# Patient Record
Sex: Female | Born: 1985
Health system: Southern US, Community
[De-identification: ages and names within clinical notes are randomized; demographics above are authoritative.]

## PROBLEM LIST (undated history)

## (undated) ENCOUNTER — Inpatient Hospital Stay (HOSPITAL_COMMUNITY): Payer: Self-pay

## (undated) DIAGNOSIS — N289 Disorder of kidney and ureter, unspecified: Secondary | ICD-10-CM

## (undated) DIAGNOSIS — R87629 Unspecified abnormal cytological findings in specimens from vagina: Secondary | ICD-10-CM

## (undated) DIAGNOSIS — N200 Calculus of kidney: Secondary | ICD-10-CM

## (undated) DIAGNOSIS — K8020 Calculus of gallbladder without cholecystitis without obstruction: Secondary | ICD-10-CM

## (undated) DIAGNOSIS — J45909 Unspecified asthma, uncomplicated: Secondary | ICD-10-CM

## (undated) HISTORY — DX: Calculus of gallbladder without cholecystitis without obstruction: K80.20

---
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
## 2009-11-03 ENCOUNTER — Ambulatory Visit (HOSPITAL_COMMUNITY): Admission: RE | Admit: 2009-11-03 | Discharge: 2009-11-03 | Payer: Self-pay | Admitting: Obstetrics and Gynecology

## 2009-12-30 ENCOUNTER — Inpatient Hospital Stay (HOSPITAL_COMMUNITY): Admission: AD | Admit: 2009-12-30 | Discharge: 2009-12-31 | Payer: Self-pay | Admitting: Obstetrics and Gynecology

## 2009-12-30 ENCOUNTER — Ambulatory Visit: Payer: Self-pay | Admitting: Obstetrics & Gynecology

## 2010-01-15 ENCOUNTER — Emergency Department (HOSPITAL_COMMUNITY): Admission: EM | Admit: 2010-01-15 | Discharge: 2010-01-15 | Payer: Self-pay | Admitting: Emergency Medicine

## 2010-03-19 ENCOUNTER — Inpatient Hospital Stay (HOSPITAL_COMMUNITY): Admission: AD | Admit: 2010-03-19 | Discharge: 2010-03-20 | Payer: Self-pay | Admitting: Obstetrics and Gynecology

## 2010-03-25 ENCOUNTER — Inpatient Hospital Stay (HOSPITAL_COMMUNITY): Admission: AD | Admit: 2010-03-25 | Discharge: 2010-03-28 | Payer: Self-pay | Admitting: Obstetrics and Gynecology

## 2010-03-26 ENCOUNTER — Encounter (INDEPENDENT_AMBULATORY_CARE_PROVIDER_SITE_OTHER): Payer: Self-pay | Admitting: Obstetrics and Gynecology

## 2010-05-31 ENCOUNTER — Encounter: Payer: Self-pay | Admitting: Obstetrics and Gynecology

## 2010-07-21 LAB — CBC
HCT: 38.7 % (ref 36.0–46.0)
Hemoglobin: 10.7 g/dL — ABNORMAL LOW (ref 12.0–15.0)
Hemoglobin: 13.1 g/dL (ref 12.0–15.0)
MCHC: 34 g/dL (ref 30.0–36.0)
MCHC: 34.3 g/dL (ref 30.0–36.0)
MCV: 97.3 fL (ref 78.0–100.0)
Platelets: 270 10*3/uL (ref 150–400)
RBC: 3.18 MIL/uL — ABNORMAL LOW (ref 3.87–5.11)
RDW: 14.5 % (ref 11.5–15.5)
WBC: 13.4 10*3/uL — ABNORMAL HIGH (ref 4.0–10.5)

## 2010-07-23 LAB — COMPREHENSIVE METABOLIC PANEL
ALT: 19 U/L (ref 0–35)
AST: 23 U/L (ref 0–37)
CO2: 22 mEq/L (ref 19–32)
Calcium: 8.7 mg/dL (ref 8.4–10.5)
GFR calc Af Amer: >60 mL/min (ref >60)
GFR calc non Af Amer: >60 mL/min (ref >60)
Sodium: 134 mEq/L — ABNORMAL LOW (ref 135–145)

## 2010-07-23 LAB — GLUCOSE, CAPILLARY

## 2010-07-24 LAB — WET PREP, GENITAL: Trich, Wet Prep: NONE SEEN

## 2010-07-24 LAB — URINALYSIS, ROUTINE W REFLEX MICROSCOPIC
Bilirubin Urine: NEGATIVE
Nitrite: NEGATIVE
Specific Gravity, Urine: 1.01 (ref 1.005–1.030)
pH: 6.5 (ref 5.0–8.0)

## 2011-06-09 ENCOUNTER — Emergency Department (HOSPITAL_COMMUNITY)
Admission: EM | Admit: 2011-06-09 | Discharge: 2011-06-09 | Disposition: A | Discharge status: Home / Self Care | Payer: Medicaid Other | Attending: Emergency Medicine | Admitting: Emergency Medicine

## 2011-06-09 ENCOUNTER — Encounter (HOSPITAL_COMMUNITY): Payer: Self-pay | Admitting: *Deleted

## 2011-06-09 DIAGNOSIS — IMO0002 Reserved for concepts with insufficient information to code with codable children: Secondary | ICD-10-CM | POA: Insufficient documentation

## 2011-06-09 DIAGNOSIS — S0501XA Injury of conjunctiva and corneal abrasion without foreign body, right eye, initial encounter: Secondary | ICD-10-CM

## 2011-06-09 DIAGNOSIS — H5789 Other specified disorders of eye and adnexa: Secondary | ICD-10-CM | POA: Insufficient documentation

## 2011-06-09 DIAGNOSIS — H5710 Ocular pain, unspecified eye: Secondary | ICD-10-CM | POA: Insufficient documentation

## 2011-06-09 DIAGNOSIS — S058X9A Other injuries of unspecified eye and orbit, initial encounter: Secondary | ICD-10-CM | POA: Insufficient documentation

## 2011-06-09 DIAGNOSIS — S0510XA Contusion of eyeball and orbital tissues, unspecified eye, initial encounter: Secondary | ICD-10-CM | POA: Insufficient documentation

## 2011-06-09 HISTORY — DX: Disorder of kidney and ureter, unspecified: N28.9

## 2011-06-09 MED ORDER — IBUPROFEN 200 MG PO TABS
600.0000 mg | ORAL_TABLET | Freq: Once | ORAL | Status: AC
  Administered 2011-06-09: 600 mg via ORAL
  Filled 2011-06-09: qty 3

## 2011-06-09 MED ORDER — FLUORESCEIN SODIUM 1 MG OP STRP
ORAL_STRIP | OPHTHALMIC | Status: AC
  Filled 2011-06-09: qty 1

## 2011-06-09 MED ORDER — TETRACAINE HCL 0.5 % OP SOLN
OPHTHALMIC | Status: AC
  Filled 2011-06-09: qty 2

## 2011-06-09 NOTE — ED Notes (Signed)
Visual acuity resulted: LEFT- 20/30  RIGHT- 20/25  BOTH- 20/20

## 2011-06-09 NOTE — ED Provider Notes (Signed)
History     CSN: [id number]  Arrival date & time 06/09/11  1541   First MD Initiated Contact with Patient 06/09/11 1614      Chief Complaint  Patient presents with  . Eye Injury    (Consider location/radiation/quality/duration/timing/severity/associated sxs/prior treatment) HPI Comments: 26yo AAF with PMH significant for kidney stones who presents to the ED due to eye pain. She was playing with her toddler son when he accidentally put his finger into her right eye.   Patient is a 26 y.o. female presenting with eye injury. The history is provided by the patient.  Eye Injury This is a new problem. Episode onset: about 4.5h prior to arrival. Episode frequency: once. The problem has been unchanged. Pertinent negatives include no abdominal pain, chest pain, congestion, fever, headaches, nausea, neck pain, numbness, sore throat, visual change or vomiting. The symptoms are aggravated by nothing. She has tried acetaminophen for the symptoms. The treatment provided mild relief.    Past Medical History  Diagnosis Date  . Renal disorder     History reviewed. No pertinent past surgical history.  History reviewed. No pertinent family history.  History  Substance Use Topics  . Smoking status: Not on file  . Smokeless tobacco: Not on file  . Alcohol Use: No    OB History    Grav Para Term Preterm Abortions TAB SAB Ect Mult Living                  Review of Systems  Constitutional: Negative for fever.  HENT: Negative for congestion, sore throat, rhinorrhea and neck pain.   Eyes: Positive for pain and redness. Negative for photophobia and visual disturbance.  Respiratory: Negative for shortness of breath.   Cardiovascular: Negative for chest pain.  Gastrointestinal: Negative for nausea, vomiting and abdominal pain.  Skin: Negative for wound.  Neurological: Negative for numbness and headaches.  All other systems reviewed and are negative.    Allergies  Latex  Home  Medications   Current Outpatient Rx  Name Route Sig Dispense Refill  . ACETAMINOPHEN 500 MG PO TABS Oral Take 500 mg by mouth every 6 (six) hours as needed. For pain      BP 128/74  Pulse 80  Temp(Src) 98.3 F (36.8 C) (Oral)  Resp 18  Ht 5\' 5"  (1.651 m)  Wt 161 lb (73.029 kg)  BMI 26.79 kg/m2  SpO2 97%  LMP 05/26/2011  Physical Exam  Nursing note and vitals reviewed. Constitutional: She is oriented to person, place, and time. She appears well-developed and well-nourished. No distress.  HENT:  Mouth/Throat: Oropharynx is clear and moist.  Eyes: Conjunctivae and EOM are normal. Pupils are equal, round, and reactive to light. No foreign bodies found. Right eye exhibits discharge (clear). Left eye exhibits no discharge. Right conjunctiva has no hemorrhage. Left conjunctiva has no hemorrhage. No scleral icterus.  Fundoscopic exam:      The right eye shows no hemorrhage and no papilledema.  Slit lamp exam:      The right eye shows corneal abrasion. The right eye shows no corneal flare, no corneal ulcer, no foreign body, no hyphema and no hypopyon.       4mm ovoid shaped corneal abrasion to right eye at 7 o'clock position. No fluid coming from abrasion. Exam with slit lamp and fluorescein stain.   Neck: Normal range of motion. Neck supple.  Cardiovascular: Normal rate, regular rhythm, normal heart sounds and intact distal pulses.   Pulmonary/Chest: Effort normal and breath  sounds normal. No respiratory distress. She has no wheezes. She has no rales.  Abdominal: Soft. She exhibits no distension. There is no tenderness.  Musculoskeletal: Normal range of motion.  Neurological: She is alert and oriented to person, place, and time. She has normal strength. No cranial nerve deficit or sensory deficit. GCS eye subscore is 4. GCS verbal subscore is 5. GCS motor subscore is 6.    ED Course  Procedures (including critical care time)  Labs Reviewed - No data to display No results  found.   1. Corneal abrasion, right       MDM  26yo AAF with PMH significant for kidney stones who presents to the ED due to eye pain. She was playing with her toddler son when he accidentally put his finger into her right eye. No vision changes. No headache. No bleeding or discharge.   Tetracaine gtts applied.   Exam with slit lamp and fluoroscein stain. Exam c/w corneal abrasion. Giving motrin. Recommend f/u with optho and pt agrees.         Verne Carrow, MD 06/09/11 [phone]

## 2011-06-09 NOTE — ED Provider Notes (Addendum)
I saw and evaluated the patient, reviewed the resident's note and I agree with the findings and plan. Pt c/o accidental injru to right eye after son poked her in eye with finger.   + corneal abrasion.  Will tx with abxs and analgesics.  Nicholes Stairs, MD 06/09/11 1819  Nicholes Stairs, MD 06/10/11 1512  Nicholes Stairs, MD 06/10/11 [phone]

## 2011-06-09 NOTE — ED Notes (Signed)
Reports being poked in right eye, having severe pain, burning and drainage, no change in vision.

## 2011-06-10 NOTE — ED Provider Notes (Signed)
I saw and evaluated the patient, reviewed the resident's note and I agree with the findings and plan.  Nicholes Stairs, MD 06/10/11 [phone]

## 2011-12-26 ENCOUNTER — Emergency Department (HOSPITAL_COMMUNITY)
Admission: EM | Admit: 2011-12-26 | Discharge: 2011-12-26 | Disposition: A | Discharge status: Home / Self Care | Payer: 59 | Attending: Emergency Medicine | Admitting: Emergency Medicine

## 2011-12-26 ENCOUNTER — Encounter (HOSPITAL_COMMUNITY): Payer: Self-pay | Admitting: Emergency Medicine

## 2011-12-26 DIAGNOSIS — X58XXXA Exposure to other specified factors, initial encounter: Secondary | ICD-10-CM | POA: Insufficient documentation

## 2011-12-26 DIAGNOSIS — Z91040 Latex allergy status: Secondary | ICD-10-CM | POA: Insufficient documentation

## 2011-12-26 DIAGNOSIS — S0501XA Injury of conjunctiva and corneal abrasion without foreign body, right eye, initial encounter: Secondary | ICD-10-CM

## 2011-12-26 DIAGNOSIS — Z87442 Personal history of urinary calculi: Secondary | ICD-10-CM | POA: Insufficient documentation

## 2011-12-26 DIAGNOSIS — N289 Disorder of kidney and ureter, unspecified: Secondary | ICD-10-CM | POA: Insufficient documentation

## 2011-12-26 DIAGNOSIS — S058X9A Other injuries of unspecified eye and orbit, initial encounter: Secondary | ICD-10-CM | POA: Insufficient documentation

## 2011-12-26 HISTORY — DX: Calculus of kidney: N20.0

## 2011-12-26 MED ORDER — HYDROCODONE-ACETAMINOPHEN 5-325 MG PO TABS
2.0000 | ORAL_TABLET | ORAL | Status: AC | PRN
Start: 2011-12-26 — End: 2012-01-05

## 2011-12-26 MED ORDER — ERYTHROMYCIN 5 MG/GM OP OINT: TOPICAL_OINTMENT | OPHTHALMIC | Status: AC

## 2011-12-26 MED ORDER — HOMATROPINE HBR 2 % OP SOLN
2.0000 [drp] | Freq: Once | OPHTHALMIC | Status: DC
  Filled 2011-12-26: qty 5

## 2011-12-26 MED ORDER — TETRACAINE HCL 0.5 % OP SOLN
OPHTHALMIC | Status: AC
  Filled 2011-12-26: qty 2

## 2011-12-26 MED ORDER — HOMATROPINE HBR 5 % OP SOLN: 2.0000 [drp] | Freq: Once | OPHTHALMIC | Status: DC

## 2011-12-26 MED ORDER — HOMATROPINE HBR 5 % OP SOLN: 1.0000 [drp] | Freq: Once | OPHTHALMIC | Status: AC

## 2011-12-26 NOTE — ED Provider Notes (Signed)
I saw and evaluated the patient, reviewed the resident's note and I agree with the findings and plan.   .Face to face Exam:  General:  Awake HEENT:  Atraumatic Resp:  Normal effort Abd:  Nondistended Neuro:No focal weakness Lymph: No adenopathy   Nelia Shi, MD 12/26/11 1750

## 2011-12-26 NOTE — ED Notes (Signed)
Right eye red, swollen, draining onset yesterday.

## 2011-12-26 NOTE — ED Notes (Signed)
Pt out of town and developed eye discomfort yesterday. Today eye red, draining, painful, feels like "something" is in my eye.

## 2011-12-26 NOTE — ED Provider Notes (Signed)
History     CSN: [id number]  Arrival date & time 12/26/11  1449   First MD Initiated Contact with Patient 12/26/11 1551      Chief Complaint  Patient presents with  . Eye Pain    (Consider location/radiation/quality/duration/timing/severity/associated sxs/prior treatment) HPI Comments: Patient presents with a 2 day history of right eye pain. She reports waking up with eye pain, as if there was something in her eye. She reports her eye being red and watering, painful, and swollen. She reports blurry vision in the right eye. She denies trauma to that eye. She reports mild relief of the pain with ibuprofen. She denies itching, sores, or recent illness.   Patient is a 26 y.o. female presenting with eye pain.  Eye Pain Pertinent negatives include no abdominal pain, chills, congestion, coughing, diaphoresis, fatigue, headaches, nausea, rash or vomiting.    Past Medical History  Diagnosis Date  . Renal disorder   . Kidney calculus     History reviewed. No pertinent past surgical history.  No family history on file.  History  Substance Use Topics  . Smoking status: Never Smoker   . Smokeless tobacco: Never Used  . Alcohol Use: Yes     wine rarely    OB History    Grav Para Term Preterm Abortions TAB SAB Ect Mult Living                  Review of Systems  Constitutional: Negative for chills, diaphoresis and fatigue.  HENT: Negative for congestion.   Eyes: Positive for photophobia, pain, redness and visual disturbance. Negative for discharge and itching.  Respiratory: Negative for cough and shortness of breath.   Gastrointestinal: Negative for nausea, vomiting, abdominal pain and diarrhea.  Skin: Negative for rash and wound.  Neurological: Negative for dizziness, light-headedness and headaches.    Allergies  Latex  Home Medications   Current Outpatient Rx  Name Route Sig Dispense Refill  . ACETAMINOPHEN 500 MG PO TABS Oral Take 500 mg by mouth every 6 (six) hours  as needed. For pain    . NORETHINDRONE 0.35 MG PO TABS Oral Take 1 tablet by mouth daily.    . ERYTHROMYCIN 5 MG/GM OP OINT  Place a 1/2 inch ribbon of ointment into the lower eyelid. 1 g 0  . HOMATROPINE HBR 5 % OP SOLN Right Eye Place 1 drop into the right eye once. 15 mL 0  . HYDROCODONE-ACETAMINOPHEN 5-325 MG PO TABS Oral Take 2 tablets by mouth every 4 (four) hours as needed for pain. 6 tablet 0    BP 145/80  Pulse 74  Temp 98.6 F (37 C) (Oral)  Resp 16  SpO2 100%  LMP 11/27/2011  Physical Exam  Nursing note and vitals reviewed. Constitutional: She is oriented to person, place, and time. She appears well-developed and well-nourished. No distress.  HENT:  Head: Normocephalic and atraumatic.  Mouth/Throat: Oropharynx is clear and moist. No oropharyngeal exudate.  Eyes:       R eye shows scleral injection and profuse watering. L eye shows clear sclera and no swelling.   Neck: Normal range of motion. Neck supple.  Cardiovascular: Normal rate and regular rhythm.  Exam reveals no gallop and no friction rub.   No murmur heard. Pulmonary/Chest: Effort normal. She has no wheezes. She has no rales. She exhibits no tenderness.  Musculoskeletal: Normal range of motion.  Neurological: She is alert and oriented to person, place, and time.  Skin: Skin is warm and  dry. She is not diaphoretic.  Psychiatric: She has a normal mood and affect. Her behavior is normal.    ED Course  Procedures (including critical care time)  Labs Reviewed - No data to display No results found.   1. Corneal abrasion, right       MDM  4:56 PM Patient evaluated using fluorescin staining. During which, a corneal abrasion was identified. I will discharge her with ophthalmic eyrthromycin, homatropine drops, and pain medication for comfort. I have recommended her to follow up with Dr. Luciana Axe, Ophthalmology, in the morning. I will not apply a pressure patch due to [employer]'s current recommendation of  nonintervention regarding eye patching with corneal abrasions. She should return to the ED with any worsening symptoms. Plan discussed with Dr. Radford Pax who is agreeable.         Emilia Beck, PA-C 12/26/11 1736

## 2011-12-26 NOTE — ED Notes (Signed)
Discharge instructions reviewed w/ pt., verbalizes understanding. Three prescriptions provided at discharge. 

## 2014-06-26 LAB — OB RESULTS CONSOLE GC/CHLAMYDIA
Chlamydia: NEGATIVE
Gonorrhea: NEGATIVE

## 2014-06-26 LAB — OB RESULTS CONSOLE ABO/RH: RH TYPE: POSITIVE

## 2014-06-26 LAB — OB RESULTS CONSOLE RPR: RPR: NONREACTIVE

## 2014-06-26 LAB — OB RESULTS CONSOLE RUBELLA ANTIBODY, IGM: RUBELLA: IMMUNE

## 2014-06-26 LAB — OB RESULTS CONSOLE HEPATITIS B SURFACE ANTIGEN: HEP B S AG: NEGATIVE

## 2014-06-26 LAB — OB RESULTS CONSOLE HIV ANTIBODY (ROUTINE TESTING): HIV: NONREACTIVE

## 2014-06-26 LAB — OB RESULTS CONSOLE ANTIBODY SCREEN: Antibody Screen: NEGATIVE

## 2014-10-28 ENCOUNTER — Encounter (HOSPITAL_COMMUNITY): Payer: Self-pay | Admitting: *Deleted

## 2014-10-28 ENCOUNTER — Inpatient Hospital Stay (HOSPITAL_COMMUNITY): Payer: BLUE CROSS/BLUE SHIELD

## 2014-10-28 ENCOUNTER — Observation Stay (HOSPITAL_COMMUNITY)
Admission: AD | Admit: 2014-10-28 | Discharge: 2014-10-30 | Disposition: A | Discharge status: Home / Self Care | Payer: BLUE CROSS/BLUE SHIELD | Source: Ambulatory Visit | Attending: Obstetrics and Gynecology | Admitting: Obstetrics and Gynecology

## 2014-10-28 DIAGNOSIS — Z3A33 33 weeks gestation of pregnancy: Secondary | ICD-10-CM | POA: Insufficient documentation

## 2014-10-28 DIAGNOSIS — N200 Calculus of kidney: Secondary | ICD-10-CM | POA: Diagnosis not present

## 2014-10-28 DIAGNOSIS — Z91040 Latex allergy status: Secondary | ICD-10-CM | POA: Insufficient documentation

## 2014-10-28 DIAGNOSIS — O26833 Pregnancy related renal disease, third trimester: Principal | ICD-10-CM | POA: Insufficient documentation

## 2014-10-28 DIAGNOSIS — O26839 Pregnancy related renal disease, unspecified trimester: Secondary | ICD-10-CM

## 2014-10-28 DIAGNOSIS — O99891 Other specified diseases and conditions complicating pregnancy: Secondary | ICD-10-CM

## 2014-10-28 DIAGNOSIS — R109 Unspecified abdominal pain: Secondary | ICD-10-CM

## 2014-10-28 HISTORY — DX: Unspecified abnormal cytological findings in specimens from vagina: R87.629

## 2014-10-28 LAB — COMPREHENSIVE METABOLIC PANEL
ALK PHOS: 94 U/L (ref 38–126)
ALT: 13 U/L — ABNORMAL LOW (ref 14–54)
ANION GAP: 6 (ref 5–15)
AST: 28 U/L (ref 15–41)
Albumin: 3.4 g/dL — ABNORMAL LOW (ref 3.5–5.0)
BILIRUBIN TOTAL: 0.9 mg/dL (ref 0.3–1.2)
BUN: 6 mg/dL (ref 6–20)
CO2: 23 mmol/L (ref 22–32)
Calcium: 9 mg/dL (ref 8.9–10.3)
Chloride: 107 mmol/L (ref 101–111)
Creatinine, Ser: 0.65 mg/dL (ref 0.44–1.00)
GFR calc non Af Amer: >60 mL/min (ref >60)
GLUCOSE: 96 mg/dL (ref 65–99)
Potassium: 4.9 mmol/L (ref 3.5–5.1)
SODIUM: 136 mmol/L (ref 135–145)
Total Protein: 6.7 g/dL (ref 6.5–8.1)

## 2014-10-28 LAB — CBC WITH DIFFERENTIAL/PLATELET
BASOS ABS: 0 10*3/uL (ref 0.0–0.1)
BASOS PCT: 0 % (ref 0–1)
EOS ABS: 0.1 10*3/uL (ref 0.0–0.7)
EOS PCT: 1 % (ref 0–5)
HCT: 39.8 % (ref 36.0–46.0)
Hemoglobin: 13.3 g/dL (ref 12.0–15.0)
Lymphocytes Relative: 24 % (ref 12–46)
Lymphs Abs: 2.5 10*3/uL (ref 0.7–4.0)
MCH: 31.5 pg (ref 26.0–34.0)
MCHC: 33.4 g/dL (ref 30.0–36.0)
MCV: 94.3 fL (ref 78.0–100.0)
MONO ABS: 1 10*3/uL (ref 0.1–1.0)
Monocytes Relative: 10 % (ref 3–12)
NEUTROS PCT: 65 % (ref 43–77)
Neutro Abs: 6.6 10*3/uL (ref 1.7–7.7)
PLATELETS: 252 10*3/uL (ref 150–400)
RBC: 4.22 MIL/uL (ref 3.87–5.11)
RDW: 14.5 % (ref 11.5–15.5)
WBC: 10.2 10*3/uL (ref 4.0–10.5)

## 2014-10-28 LAB — URINALYSIS, DIPSTICK ONLY
Bilirubin Urine: NEGATIVE
Glucose, UA: NEGATIVE mg/dL
Ketones, ur: NEGATIVE mg/dL
NITRITE: NEGATIVE
PH: 6 (ref 5.0–8.0)
Protein, ur: NEGATIVE mg/dL
Urobilinogen, UA: 0.2 mg/dL (ref 0.0–1.0)

## 2014-10-28 MED ORDER — HYDROMORPHONE HCL 1 MG/ML IJ SOLN: 1.0000 mg | Freq: Once | INTRAMUSCULAR | Status: DC

## 2014-10-28 MED ORDER — LACTATED RINGERS IV BOLUS (SEPSIS)
1000.0000 mL | Freq: Once | INTRAVENOUS | Status: AC
  Administered 2014-10-28: 1000 mL via INTRAVENOUS

## 2014-10-28 MED ORDER — FENTANYL CITRATE (PF) 100 MCG/2ML IJ SOLN
50.0000 ug | Freq: Once | INTRAMUSCULAR | Status: AC
  Administered 2014-10-29: 50 ug via INTRAVENOUS
  Filled 2014-10-28: qty 2

## 2014-10-28 MED ORDER — ONDANSETRON HCL 4 MG/2ML IJ SOLN
4.0000 mg | Freq: Once | INTRAMUSCULAR | Status: AC
  Administered 2014-10-28: 4 mg via INTRAVENOUS
  Filled 2014-10-28: qty 2

## 2014-10-28 MED ORDER — HYDROMORPHONE HCL 1 MG/ML IJ SOLN
1.0000 mg | Freq: Once | INTRAMUSCULAR | Status: AC
  Administered 2014-10-28: 1 mg via INTRAVENOUS
  Filled 2014-10-28: qty 1

## 2014-10-28 NOTE — MAU Note (Signed)
Pt started with left flank pain today while at work.  Progressively became worse along with nausea and vomiting with worsening pain.  Denies pain or blood upon urination.  Reports good fetalmovement.

## 2014-10-28 NOTE — MAU Provider Note (Signed)
History     CSN: [id number]  Arrival date and time: 10/28/14 2148   First Provider Initiated Contact with Patient 10/28/14 2221      No chief complaint on file.  HPI  Tina Anderson is a 29 y.o. G2P1001 at [redacted]w[redacted]d who presents to MAU today with complaint of left flank pain since this afternoon that has progressively worsened. The patient states pain is severe. She took Tylenol without relief. She denies hematuria, vaginal bleeding or contractions upon arrival in MAU. She reports +N/V with pain. She reports good fetal movement. She states a history of a previous kidney stone.   OB History    Gravida Para Term Preterm AB TAB SAB Ectopic Multiple Living   Past Medical History  Diagnosis Date  . Renal disorder   . Kidney calculus   . Vaginal Pap smear, abnormal     History reviewed. No pertinent past surgical history.  History reviewed. No pertinent family history.  History  Substance Use Topics  . Smoking status: Never Smoker   . Smokeless tobacco: Never Used  . Alcohol Use: Yes     Comment: wine rarely, not while preg    Allergies:  Allergies  Allergen Reactions  . Latex Hives    Prescriptions prior to admission  Medication Sig Dispense Refill Last Dose  . acetaminophen (TYLENOL) 325 MG tablet Take 325 mg by mouth every 6 (six) hours as needed for moderate pain.   10/28/2014 at Unknown time  . Prenatal Vit-Fe Fumarate-FA (PRENATAL MULTIVITAMIN) TABS tablet Take 1 tablet by mouth daily at 12 noon.   10/27/2014 at Unknown time  . ranitidine (ZANTAC) 150 MG tablet Take 150 mg by mouth 2 (two) times daily.   10/28/2014 at Unknown time  . acetaminophen (TYLENOL) 500 MG tablet Take 500 mg by mouth every 6 (six) hours as needed. For pain   Not Taking    Review of Systems  Constitutional: Negative for fever and malaise/fatigue.  Gastrointestinal: Positive for nausea and vomiting. Negative for abdominal pain, diarrhea and constipation.  Genitourinary:  Positive for flank pain. Negative for dysuria, urgency, frequency and hematuria.       Neg - vaginal bleeding, discharge   Physical Exam   Blood pressure 131/70, pulse 73, temperature 98 F (36.7 C), temperature source Oral, resp. rate 20.  Physical Exam  Nursing note and vitals reviewed. Constitutional: She is oriented to person, place, and time. She appears well-developed and well-nourished. No distress.  Appears uncomfortable  HENT:  Head: Normocephalic and atraumatic.  Cardiovascular: Normal rate.   Respiratory: Effort normal.  GI: Soft. She exhibits no distension and no mass. There is no tenderness. There is CVA tenderness (left). There is no rebound and no guarding.  Neurological: She is alert and oriented to person, place, and time.  Skin: Skin is warm and dry. No erythema.  Psychiatric: She has a normal mood and affect.   Results for orders placed or performed during the hospital encounter of 10/28/14 (from the past 24 hour(s))  Urinalysis, dipstick only     Status: Abnormal   Collection Time: 10/28/14 10:05 PM  Result Value Ref Range   Specific Gravity, Urine >1.030 (H) 1.005 - 1.030   pH 6.0 5.0 - 8.0   Glucose, UA NEGATIVE NEGATIVE mg/dL   Hgb urine dipstick TRACE (A) NEGATIVE   Bilirubin Urine NEGATIVE NEGATIVE   Ketones, ur NEGATIVE NEGATIVE mg/dL  Protein, ur NEGATIVE NEGATIVE mg/dL   Urobilinogen, UA 0.2 0.0 - 1.0 mg/dL   Nitrite NEGATIVE NEGATIVE   Leukocytes, UA TRACE (A) NEGATIVE  CBC with Differential/Platelet     Status: None   Collection Time: 10/28/14 10:33 PM  Result Value Ref Range   WBC 10.2 4.0 - 10.5 K/uL   RBC 4.22 3.87 - 5.11 MIL/uL   Hemoglobin 13.3 12.0 - 15.0 g/dL   HCT 04.5 40.9 - 81.1 %   MCV 94.3 78.0 - 100.0 fL   MCH 31.5 26.0 - 34.0 pg   MCHC 33.4 30.0 - 36.0 g/dL   RDW 91.4 78.2 - 95.6 %   Platelets 252 150 - 400 K/uL   Neutrophils Relative % 65 43 - 77 %   Neutro Abs 6.6 1.7 - 7.7 K/uL   Lymphocytes Relative 24 12 - 46 %    Lymphs Abs 2.5 0.7 - 4.0 K/uL   Monocytes Relative 10 3 - 12 %   Monocytes Absolute 1.0 0.1 - 1.0 K/uL   Eosinophils Relative 1 0 - 5 %   Eosinophils Absolute 0.1 0.0 - 0.7 K/uL   Basophils Relative 0 0 - 1 %   Basophils Absolute 0.0 0.0 - 0.1 K/uL  Comprehensive metabolic panel     Status: Abnormal   Collection Time: 10/28/14 10:33 PM  Result Value Ref Range   Sodium 136 135 - 145 mmol/L   Potassium 4.9 3.5 - 5.1 mmol/L   Chloride 107 101 - 111 mmol/L   CO2 23 22 - 32 mmol/L   Glucose, Bld 96 65 - 99 mg/dL   BUN 6 6 - 20 mg/dL   Creatinine, Ser 2.13 0.44 - 1.00 mg/dL   Calcium 9.0 8.9 - 08.6 mg/dL   Total Protein 6.7 6.5 - 8.1 g/dL   Albumin 3.4 (L) 3.5 - 5.0 g/dL   AST 28 15 - 41 U/L   ALT 13 (L) 14 - 54 U/L   Alkaline Phosphatase 94 38 - 126 U/L   Total Bilirubin 0.9 0.3 - 1.2 mg/dL   GFR calc non Af Amer >60 >60 mL/min   GFR calc Af Amer >60 >60 mL/min   Anion gap 6 5 - 15    US Renal  10/29/2014   CLINICAL DATA:  Patient with left flank pain.  Nausea and vomiting.  EXAM: RENAL / URINARY TRACT ULTRASOUND COMPLETE  COMPARISON:  None.  FINDINGS: Right Kidney:  Length: 10.9 cm. Possible 4 mm nonobstructing stone superior pole right kidney. No hydronephrosis.  Left Kidney:  Length: 12.7 cm.  No hydronephrosis.  Bladder:  Appears normal for degree of bladder distention.  IMPRESSION: No hydronephrosis.  Possible nonobstructing stone superior pole right kidney.   Electronically Signed   By: Annia Belt M.D.   On: 10/29/2014 00:21    Fetal Monitoring: Baseline: 130 bpm, moderate variability, + accelerations, no decelerations Contractions: initially UI noted, after patient returned from [place] contractions q 4-5 minutes, mild  MAU Course  Procedures None  MDM Dr. Senaida Ores called ahead to MAU regarding this patient. Recommends labs, pain medication, fetal monitoring and possible renal US if exam warrants CBC, CMP, UA and renal US ordered 1 mg Dilaudid IV and 4 mg Zofran IV  given Patient states continued severe pain when she returned from [place]. 50 mcg Fentanyl given.  Discussed patient and lab/US results with Dr. Senaida Ores. She recommends admission for 24 hour observation for IV fluids and pain management.  Verbal orders with readback include: EFM 30  minutes q shift, Dilaudid IV 1 mg q 2 hours PRN for pain, Zofran PRN for nausea, activity as tolerated, regular diet and continuous IV fluids Assessment and Plan  A: [facility] at [redacted]w[redacted]d Kidney Stone  P: Admit to antenatal for IV fluids and pain management  Marny Lowenstein, PA-C  10/29/2014, 12:38 AM

## 2014-10-29 ENCOUNTER — Encounter (HOSPITAL_COMMUNITY): Payer: Self-pay | Admitting: Medical

## 2014-10-29 DIAGNOSIS — O26839 Pregnancy related renal disease, unspecified trimester: Secondary | ICD-10-CM

## 2014-10-29 DIAGNOSIS — N200 Calculus of kidney: Secondary | ICD-10-CM | POA: Diagnosis not present

## 2014-10-29 DIAGNOSIS — O26833 Pregnancy related renal disease, third trimester: Secondary | ICD-10-CM

## 2014-10-29 MED ORDER — ZOLPIDEM TARTRATE 5 MG PO TABS
5.0000 mg | ORAL_TABLET | Freq: Every evening | ORAL | Status: DC | PRN
  Administered 2014-10-29: 5 mg via ORAL
  Filled 2014-10-29: qty 1

## 2014-10-29 MED ORDER — ONDANSETRON HCL 4 MG/2ML IJ SOLN
4.0000 mg | Freq: Four times a day (QID) | INTRAMUSCULAR | Status: DC | PRN
  Administered 2014-10-29: 4 mg via INTRAVENOUS
  Filled 2014-10-29: qty 2

## 2014-10-29 MED ORDER — ONDANSETRON HCL 4 MG/2ML IJ SOLN
4.0000 mg | Freq: Once | INTRAMUSCULAR | Status: AC
  Administered 2014-10-29: 4 mg via INTRAVENOUS

## 2014-10-29 MED ORDER — PRENATAL MULTIVITAMIN CH
1.0000 | ORAL_TABLET | Freq: Every day | ORAL | Status: DC
  Administered 2014-10-29: 1 via ORAL
  Filled 2014-10-29: qty 1

## 2014-10-29 MED ORDER — SODIUM CHLORIDE 0.9 % IJ SOLN: 9.0000 mL | INTRAMUSCULAR | Status: DC | PRN

## 2014-10-29 MED ORDER — PRENATAL MULTIVITAMIN CH: 1.0000 | ORAL_TABLET | Freq: Every day | ORAL | Status: DC

## 2014-10-29 MED ORDER — DOCUSATE SODIUM 100 MG PO CAPS
100.0000 mg | ORAL_CAPSULE | Freq: Every day | ORAL | Status: DC
  Administered 2014-10-30: 100 mg via ORAL
  Filled 2014-10-29: qty 1

## 2014-10-29 MED ORDER — HYDROMORPHONE 0.3 MG/ML IV SOLN
INTRAVENOUS | Status: DC
  Administered 2014-10-29: 0 mg via INTRAVENOUS
  Administered 2014-10-29: 14:00:00 via INTRAVENOUS
  Administered 2014-10-29: 3.8 mg via INTRAVENOUS
  Administered 2014-10-29: 0.9 mg via INTRAVENOUS
  Administered 2014-10-29: 06:00:00 via INTRAVENOUS
  Administered 2014-10-29: 4.67 mL via INTRAVENOUS
  Administered 2014-10-30: 0 mg via INTRAVENOUS
  Administered 2014-10-30: 0.3 mg via INTRAVENOUS
  Administered 2014-10-30: 0 mg via INTRAVENOUS
  Filled 2014-10-29 (×2): qty 25

## 2014-10-29 MED ORDER — FAMOTIDINE 20 MG PO TABS
20.0000 mg | ORAL_TABLET | Freq: Two times a day (BID) | ORAL | Status: DC
  Administered 2014-10-29 – 2014-10-30 (×2): 20 mg via ORAL
  Filled 2014-10-29 (×2): qty 1

## 2014-10-29 MED ORDER — NALOXONE HCL 0.4 MG/ML IJ SOLN: 0.4000 mg | INTRAMUSCULAR | Status: DC | PRN

## 2014-10-29 MED ORDER — ACETAMINOPHEN 325 MG PO TABS: 650.0000 mg | ORAL_TABLET | ORAL | Status: DC | PRN

## 2014-10-29 MED ORDER — DIPHENHYDRAMINE HCL 12.5 MG/5ML PO ELIX
12.5000 mg | ORAL_SOLUTION | Freq: Four times a day (QID) | ORAL | Status: DC | PRN
  Filled 2014-10-29: qty 5

## 2014-10-29 MED ORDER — ONDANSETRON HCL 4 MG/2ML IJ SOLN
4.0000 mg | Freq: Four times a day (QID) | INTRAMUSCULAR | Status: DC | PRN
  Administered 2014-10-29: 4 mg via INTRAVENOUS
  Filled 2014-10-29 (×2): qty 2

## 2014-10-29 MED ORDER — HYDROMORPHONE HCL 1 MG/ML IJ SOLN
1.0000 mg | INTRAMUSCULAR | Status: DC | PRN
  Administered 2014-10-29: 1 mg via INTRAVENOUS
  Filled 2014-10-29 (×2): qty 1

## 2014-10-29 MED ORDER — DIPHENHYDRAMINE HCL 50 MG/ML IJ SOLN: 12.5000 mg | Freq: Four times a day (QID) | INTRAMUSCULAR | Status: DC | PRN

## 2014-10-29 MED ORDER — CALCIUM CARBONATE ANTACID 500 MG PO CHEW
2.0000 | CHEWABLE_TABLET | ORAL | Status: DC | PRN
  Filled 2014-10-29: qty 2

## 2014-10-29 MED ORDER — LACTATED RINGERS IV SOLN
INTRAVENOUS | Status: DC
  Administered 2014-10-29 – 2014-10-30 (×5): via INTRAVENOUS

## 2014-10-29 NOTE — Plan of Care (Signed)
Problem: Consults Goal: Birthing Suites Patient Information Press F2 to bring up selections list Outcome: Completed/Met Date Met:  10/29/14  Pt < [redacted] weeks EGA and Antenatal Patient (< 37 weeks)

## 2014-10-29 NOTE — Progress Notes (Addendum)
MD calling to check on pt and informed of pt contractions. Mild but more frequent.  No orders at this time. MD coming to see pt

## 2014-10-29 NOTE — H&P (Signed)
Tina Anderson is a 29 y.o. female G2P1001 at 80 6/7 weeks (EDD 12/18/14 by LMP c/w 9 week [place]) presented as per MAU note with onset of left flank pain and accompanying N/V when pain severe.  She has a h/o kidney stones and feels the same as that.  She passed that stone on her own.  Prenatal care otherwise uneventful.  LGSIL pap, for f/u postpartum.    Maternal Medical History:  Contractions: Perceived severity is mild.    Fetal activity: Perceived fetal activity is normal.    Prenatal Complications - Diabetes: none.    OB History    Gravida Para Term Preterm AB TAB SAB Ectopic Multiple Living   3 1 1       1     2011 7#4oz  NSVD  Past Medical History  Diagnosis Date  . Renal disorder   . Kidney calculus   . Vaginal Pap smear, abnormal    History reviewed. No pertinent past surgical history. Family History: family history is not on file. Social History:  reports that she has never smoked. She has never used smokeless tobacco. She reports that she drinks alcohol. She reports that she does not use illicit drugs.   Prenatal Transfer Tool  Maternal Diabetes: No Genetic Screening: Normal Maternal Ultrasounds/Referrals: Normal Fetal Ultrasounds or other Referrals:  None Maternal Substance Abuse:  No Significant Maternal Medications:  None Significant Maternal Lab Results:  None Other Comments:  None  Review of Systems  Constitutional: Negative for fever and chills.  Genitourinary: Positive for flank pain.      Blood pressure 117/74, pulse 108, temperature 98.4 F (36.9 C), temperature source Oral, resp. rate 21, height 5\' 5"  (1.651 m), weight 92.987 kg (205 lb), SpO2 98 %. Maternal Exam:  Uterine Assessment: Contraction strength is mild.  Contraction frequency is regular.      Physical Exam  Constitutional: She is oriented to person, place, and time. She appears well-developed and well-nourished.  Cardiovascular: Normal rate.   Respiratory: Effort normal.  GI: Soft.   Neurological: She is alert and oriented to person, place, and time.  Psychiatric:  subdued    Prenatal labs: ABO, Rh: O/Positive/-- (02/17 0000) Antibody: Negative (02/17 0000) Rubella: Immune (02/17 0000) RPR: Nonreactive (02/17 0000)  HBsAg: Negative (02/17 0000)  HIV: Non-reactive (02/17 0000)  GBS:   unknown Quad screen negative One hour GTT 121  Assessment/Plan: Pt admitted with presumed nephrolithiasis by clinical exam.  Some difficultly initially with pain control, but now doing well on PCA.  N/V improved with zofran.  Renal US with no obstructing stone seen, no hydronephrosis. Normal creatinine and trace blood in urine on admission.  Nonobstructing stone in right kidney.  Some mild contractions but not very noticeable by patient.  D/w pt that we would continue hydration and pain management and hope the stone would pass on its own in the next 24 hours.  If no resolution by then, may need repeat renal US.  Oliver Pila 10/29/2014, 8:22 AM

## 2014-10-30 MED ORDER — HYDROMORPHONE HCL 2 MG PO TABS
2.0000 mg | ORAL_TABLET | Freq: Four times a day (QID) | ORAL | Status: DC | PRN
  Administered 2014-10-30: 2 mg via ORAL
  Filled 2014-10-30: qty 1

## 2014-10-30 MED ORDER — HYDROMORPHONE HCL 2 MG PO TABS: 2.0000 mg | ORAL_TABLET | Freq: Four times a day (QID) | ORAL | Status: DC | PRN

## 2014-10-30 NOTE — Progress Notes (Signed)
Patient ID: Tina Anderson, female   DOB: Jun 11, 1985, 29 y.o.   MRN: [id number]  Feeling better, no N/V this AM.  +FM, no LOF, no VB, occ ctx  AF VSS, good uop gen NAD FHTs 125-130, category 1 toco irregular, q 7-10 min  On Dilaudid PCA for pain from kidney stone, will d/c convert to po meds - d/c this PM D/C with dilaudid, f/u in office

## 2014-10-30 NOTE — Progress Notes (Signed)
PCA dc'd

## 2014-10-30 NOTE — Discharge Summary (Signed)
Obstetric Discharge Summary Reason for Admission: pain control - kidney stone Prenatal Procedures: NST and renal US Intrapartum Procedures: N/A Postpartum Procedures: N/A Complications-Operative and Postpartum: N/A HEMOGLOBIN  Date Value Ref Range Status  10/28/2014 13.3 12.0 - 15.0 g/dL Final   HCT  Date Value Ref Range Status  10/28/2014 39.8 36.0 - 46.0 % Final    Physical Exam:  General: alert and no distress Pain improved FHT's category 1 toco q 7-10 min  Discharge Diagnoses: kidney stone, pain better controlled  Discharge Information: Date: 10/30/2014 Activity: pelvic rest Diet: routine Medications: PNV and Dilaudid Condition: stable Instructions: refer to practice specific booklet and routine labor instructions Discharge to: home Follow-up Information    Follow up with Sherian Rein, MD.   Specialty:  Obstetrics and Gynecology   Why:  routine prenatal care. 12:00noon 11/07/14   Contact information:   [address] [place] [address] [phone]      Bovard-Stuckert, Augusto Gamble 10/30/2014, 10:06 AM

## 2014-11-15 LAB — OB RESULTS CONSOLE GBS: STREP GROUP B AG: NEGATIVE

## 2014-12-07 ENCOUNTER — Inpatient Hospital Stay (HOSPITAL_COMMUNITY)
Admission: AD | Admit: 2014-12-07 | Discharge: 2014-12-09 | DRG: 775 | Disposition: A | Discharge status: Home / Self Care | Payer: BLUE CROSS/BLUE SHIELD | Source: Ambulatory Visit | Attending: Obstetrics and Gynecology | Admitting: Obstetrics and Gynecology

## 2014-12-07 ENCOUNTER — Inpatient Hospital Stay (HOSPITAL_COMMUNITY): Payer: BLUE CROSS/BLUE SHIELD | Admitting: Anesthesiology

## 2014-12-07 ENCOUNTER — Encounter (HOSPITAL_COMMUNITY): Payer: Self-pay | Admitting: *Deleted

## 2014-12-07 DIAGNOSIS — O4292 Full-term premature rupture of membranes, unspecified as to length of time between rupture and onset of labor: Principal | ICD-10-CM | POA: Diagnosis present

## 2014-12-07 DIAGNOSIS — Z3A38 38 weeks gestation of pregnancy: Secondary | ICD-10-CM | POA: Diagnosis present

## 2014-12-07 DIAGNOSIS — O4290 Premature rupture of membranes, unspecified as to length of time between rupture and onset of labor, unspecified weeks of gestation: Secondary | ICD-10-CM | POA: Diagnosis present

## 2014-12-07 DIAGNOSIS — O4202 Full-term premature rupture of membranes, onset of labor within 24 hours of rupture: Secondary | ICD-10-CM

## 2014-12-07 DIAGNOSIS — O4200 Premature rupture of membranes, onset of labor within 24 hours of rupture, unspecified weeks of gestation: Secondary | ICD-10-CM | POA: Diagnosis present

## 2014-12-07 HISTORY — DX: Unspecified asthma, uncomplicated: J45.909

## 2014-12-07 LAB — CBC
HCT: 39.4 % (ref 36.0–46.0)
HEMOGLOBIN: 13.2 g/dL (ref 12.0–15.0)
MCH: 31.4 pg (ref 26.0–34.0)
MCHC: 33.5 g/dL (ref 30.0–36.0)
MCV: 93.6 fL (ref 78.0–100.0)
PLATELETS: 239 10*3/uL (ref 150–400)
RBC: 4.21 MIL/uL (ref 3.87–5.11)
RDW: 14.8 % (ref 11.5–15.5)
WBC: 7.5 10*3/uL (ref 4.0–10.5)

## 2014-12-07 LAB — TYPE AND SCREEN
ABO/RH(D): O POS
Antibody Screen: NEGATIVE

## 2014-12-07 LAB — POCT FERN TEST: POCT FERN TEST: POSITIVE

## 2014-12-07 MED ORDER — EPHEDRINE 5 MG/ML INJ
10.0000 mg | INTRAVENOUS | Status: DC | PRN
  Filled 2014-12-07: qty 2

## 2014-12-07 MED ORDER — OXYCODONE-ACETAMINOPHEN 5-325 MG PO TABS: 1.0000 | ORAL_TABLET | ORAL | Status: DC | PRN

## 2014-12-07 MED ORDER — TERBUTALINE SULFATE 1 MG/ML IJ SOLN
0.2500 mg | Freq: Once | INTRAMUSCULAR | Status: DC | PRN
  Filled 2014-12-07: qty 1

## 2014-12-07 MED ORDER — PRENATAL MULTIVITAMIN CH
1.0000 | ORAL_TABLET | Freq: Every day | ORAL | Status: DC
  Administered 2014-12-08 – 2014-12-09 (×2): 1 via ORAL
  Filled 2014-12-07 (×2): qty 1

## 2014-12-07 MED ORDER — OXYCODONE-ACETAMINOPHEN 5-325 MG PO TABS: 2.0000 | ORAL_TABLET | ORAL | Status: DC | PRN

## 2014-12-07 MED ORDER — OXYTOCIN 40 UNITS IN LACTATED RINGERS INFUSION - SIMPLE MED
1.0000 m[IU]/min | INTRAVENOUS | Status: DC
Start: 2014-12-07 — End: 2014-12-07
  Administered 2014-12-07: 1 m[IU]/min via INTRAVENOUS
  Filled 2014-12-07: qty 1000

## 2014-12-07 MED ORDER — MEASLES, MUMPS & RUBELLA VAC ~~LOC~~ INJ
0.5000 mL | INJECTION | Freq: Once | SUBCUTANEOUS | Status: DC
  Filled 2014-12-07: qty 0.5

## 2014-12-07 MED ORDER — OXYTOCIN 40 UNITS IN LACTATED RINGERS INFUSION - SIMPLE MED: 62.5000 mL/h | INTRAVENOUS | Status: DC

## 2014-12-07 MED ORDER — OXYTOCIN 40 UNITS IN LACTATED RINGERS INFUSION - SIMPLE MED: 62.5000 mL/h | INTRAVENOUS | Status: AC

## 2014-12-07 MED ORDER — SIMETHICONE 80 MG PO CHEW: 80.0000 mg | CHEWABLE_TABLET | ORAL | Status: DC | PRN

## 2014-12-07 MED ORDER — LANOLIN HYDROUS EX OINT: TOPICAL_OINTMENT | CUTANEOUS | Status: DC | PRN

## 2014-12-07 MED ORDER — LACTATED RINGERS IV SOLN: INTRAVENOUS | Status: AC

## 2014-12-07 MED ORDER — ACETAMINOPHEN 325 MG PO TABS: 650.0000 mg | ORAL_TABLET | ORAL | Status: DC | PRN

## 2014-12-07 MED ORDER — ZOLPIDEM TARTRATE 5 MG PO TABS: 5.0000 mg | ORAL_TABLET | Freq: Every evening | ORAL | Status: DC | PRN

## 2014-12-07 MED ORDER — PHENYLEPHRINE 40 MCG/ML (10ML) SYRINGE FOR IV PUSH (FOR BLOOD PRESSURE SUPPORT)
80.0000 ug | PREFILLED_SYRINGE | INTRAVENOUS | Status: DC | PRN
  Administered 2014-12-07 (×2): 80 ug via INTRAVENOUS
  Filled 2014-12-07: qty 20
  Filled 2014-12-07: qty 2

## 2014-12-07 MED ORDER — LIDOCAINE HCL (PF) 1 % IJ SOLN
30.0000 mL | INTRAMUSCULAR | Status: DC | PRN
Start: 2014-12-07 — End: 2014-12-07
  Administered 2014-12-07: 30 mL via SUBCUTANEOUS
  Filled 2014-12-07: qty 30

## 2014-12-07 MED ORDER — WITCH HAZEL-GLYCERIN EX PADS: 1.0000 "application " | MEDICATED_PAD | CUTANEOUS | Status: DC | PRN

## 2014-12-07 MED ORDER — LACTATED RINGERS IV SOLN
500.0000 mL | INTRAVENOUS | Status: DC | PRN
  Administered 2014-12-07 (×2): 500 mL via INTRAVENOUS

## 2014-12-07 MED ORDER — OXYCODONE-ACETAMINOPHEN 5-325 MG PO TABS
1.0000 | ORAL_TABLET | ORAL | Status: DC | PRN
Start: 2014-12-07 — End: 2014-12-09

## 2014-12-07 MED ORDER — FENTANYL 2.5 MCG/ML BUPIVACAINE 1/10 % EPIDURAL INFUSION (WH - ANES)
14.0000 mL/h | INTRAMUSCULAR | Status: DC | PRN
  Administered 2014-12-07 (×2): 14 mL/h via EPIDURAL
  Filled 2014-12-07: qty 125

## 2014-12-07 MED ORDER — ONDANSETRON HCL 4 MG/2ML IJ SOLN
4.0000 mg | Freq: Four times a day (QID) | INTRAMUSCULAR | Status: DC | PRN
  Administered 2014-12-07: 4 mg via INTRAVENOUS
  Filled 2014-12-07: qty 2

## 2014-12-07 MED ORDER — FAMOTIDINE 20 MG PO TABS
20.0000 mg | ORAL_TABLET | Freq: Every day | ORAL | Status: DC
  Administered 2014-12-09: 20 mg via ORAL
  Filled 2014-12-07 (×2): qty 1

## 2014-12-07 MED ORDER — DIPHENHYDRAMINE HCL 50 MG/ML IJ SOLN: 12.5000 mg | INTRAMUSCULAR | Status: DC | PRN

## 2014-12-07 MED ORDER — OXYTOCIN BOLUS FROM INFUSION
500.0000 mL | INTRAVENOUS | Status: DC
  Administered 2014-12-07: 500 mL via INTRAVENOUS

## 2014-12-07 MED ORDER — LIDOCAINE HCL (PF) 1 % IJ SOLN
INTRAMUSCULAR | Status: DC | PRN
  Administered 2014-12-07 (×2): 5 mL
  Administered 2014-12-07: 3 mL

## 2014-12-07 MED ORDER — CITRIC ACID-SODIUM CITRATE 334-500 MG/5ML PO SOLN: 30.0000 mL | ORAL | Status: DC | PRN

## 2014-12-07 MED ORDER — PRENATAL MULTIVITAMIN CH: 1.0000 | ORAL_TABLET | Freq: Every day | ORAL | Status: DC

## 2014-12-07 MED ORDER — TETANUS-DIPHTH-ACELL PERTUSSIS 5-2.5-18.5 LF-MCG/0.5 IM SUSP: 0.5000 mL | Freq: Once | INTRAMUSCULAR | Status: DC

## 2014-12-07 MED ORDER — BENZOCAINE-MENTHOL 20-0.5 % EX AERO
1.0000 "application " | INHALATION_SPRAY | CUTANEOUS | Status: DC | PRN
  Administered 2014-12-07: 1 via TOPICAL
  Filled 2014-12-07: qty 56

## 2014-12-07 MED ORDER — IBUPROFEN 600 MG PO TABS
600.0000 mg | ORAL_TABLET | Freq: Four times a day (QID) | ORAL | Status: DC
Start: 2014-12-07 — End: 2014-12-09
  Administered 2014-12-07 – 2014-12-09 (×8): 600 mg via ORAL
  Filled 2014-12-07 (×8): qty 1

## 2014-12-07 MED ORDER — ONDANSETRON HCL 4 MG PO TABS
4.0000 mg | ORAL_TABLET | ORAL | Status: DC | PRN
Start: 2014-12-07 — End: 2014-12-09

## 2014-12-07 MED ORDER — DIPHENHYDRAMINE HCL 25 MG PO CAPS: 25.0000 mg | ORAL_CAPSULE | Freq: Four times a day (QID) | ORAL | Status: DC | PRN

## 2014-12-07 MED ORDER — ONDANSETRON HCL 4 MG/2ML IJ SOLN: 4.0000 mg | INTRAMUSCULAR | Status: DC | PRN

## 2014-12-07 MED ORDER — DIBUCAINE 1 % RE OINT: 1.0000 "application " | TOPICAL_OINTMENT | RECTAL | Status: DC | PRN

## 2014-12-07 MED ORDER — LACTATED RINGERS IV SOLN
INTRAVENOUS | Status: DC
  Administered 2014-12-07 (×2): via INTRAVENOUS

## 2014-12-07 MED ORDER — SENNOSIDES-DOCUSATE SODIUM 8.6-50 MG PO TABS
2.0000 | ORAL_TABLET | ORAL | Status: DC
  Administered 2014-12-08: 2 via ORAL
  Filled 2014-12-07: qty 2

## 2014-12-07 NOTE — Anesthesia Preprocedure Evaluation (Signed)
Anesthesia Evaluation  Patient identified by MRN, date of birth, ID band Patient awake    Reviewed: Allergy & Precautions, H&P , NPO status , Patient's Chart, lab work & pertinent test results  History of Anesthesia Complications Negative for: history of anesthetic complications  Airway Mallampati: II  TM Distance: >3 FB Neck ROM: full    Dental no notable dental hx. (+) Teeth Intact   Pulmonary neg pulmonary ROS,  breath sounds clear to auscultation  Pulmonary exam normal       Cardiovascular negative cardio ROS Normal cardiovascular examRhythm:regular Rate:Normal     Neuro/Psych negative neurological ROS  negative psych ROS   GI/Hepatic negative GI ROS, Neg liver ROS,   Endo/Other  negative endocrine ROS  Renal/GU negative Renal ROS  negative genitourinary   Musculoskeletal   Abdominal   Peds  Hematology negative hematology ROS (+)   Anesthesia Other Findings   Reproductive/Obstetrics (+) Pregnancy                             Anesthesia Physical Anesthesia Plan  ASA: II  Anesthesia Plan: Epidural   Post-op Pain Management:    Induction:   Airway Management Planned:   Additional Equipment:   Intra-op Plan:   Post-operative Plan:   Informed Consent: I have reviewed the patients History and Physical, chart, labs and discussed the procedure including the risks, benefits and alternatives for the proposed anesthesia with the patient or authorized representative who has indicated his/her understanding and acceptance.     Plan Discussed with:   Anesthesia Plan Comments:         Anesthesia Quick Evaluation  

## 2014-12-07 NOTE — H&P (Signed)
Tina Anderson, Tina Anderson NO.:  [id number]  MEDICAL RECORD NO.:  [id number]  LOCATION:  9168                          FACILITY:  [facility]  PHYSICIAN:  Malachi Pro. Ambrose Mantle, M.D. DATE OF BIRTH:  [birth year]  DATE OF ADMISSION:  12/07/2014 DATE OF DISCHARGE:                             HISTORY & PHYSICAL   HISTORY OF PRESENT ILLNESS:  This is a 29 year old female, para 1-0-0-1, gravida 2 at 33 weeks and 3 days gestation with [facility] December 18, 2014, admitted with premature rupture of the membranes for delivery.  Her initial ultrasound was on May 17, 2014, 9 weeks and 3 days gave a due date of December 17, 2014.  Last menstrual period was March 13, 2014, and the due date was chosen as December 18, 2014.  Blood group and type O positive, negative antibody, RPR negative, urine culture negative. Hepatitis B surface antigen negative, HIV negative, GC and Chlamydia negative, Rubella immune.  Quad screen was negative.  One-hour Glucola 121.  Repeat HIV and RPR negative and group B strep negative.  The patient began her prenatal course in our office at 15 weeks and 1 day. At 27 weeks, she had only gained 3.2 pounds and was advised to gain weight.  At 32 weeks, she was admitted with a kidney stone and given pain medications and hydration until it passed, but she did not pass the stone. The pain resolved.  At her last prenatal visit on December 03, 2014, the fundal height was 40 cm, cervix was 3 cm, 50%, vertex at a -3. Today, she had spontaneous rupture of membranes.  She came to the hospital, ruptured membranes was confirmed with clear fluid.  She was kept in MAU for quite some time because either staffing or room was not available. After she came to the room, she was placed on Pitocin and began to have regular contractions.  She received an epidural and progressed to 4 cm.  She then fairly rapidly progressed to 6 cm and then to full dilatation.  I attended the delivery and the patient  had received very little benefit from her epidural and was quite out of control.  There was some fetal bradycardia in the minute or 2 prior to delivery and I was going to place a vacuum but she went ahead and pushed the baby out.  The baby was delivered vertex without any shoulder dystocia.  Apgar was 5 at 1 minute, rapidly became 9. Dr. Joana Reamer was called. She attended the baby and I am going to let her assign the 5 minute Apgar.  PAST MEDICAL HISTORY:  She has had a history of a kidney stone that was passed in 2012.  Denies surgical history.  ALLERGIES:  Latex allergy.  No known drug allergies.  Food allergy to chocolate.  SOCIAL HISTORY:  She does have a history of an abnormal Pap smear.  She has had no history of sexually transmitted diseases.  FAMILY HISTORY:  Mother with high blood pressure and seizure disorder since an infant.  Father with  sarcoidosis.  OBSTETRIC HISTORY:  In 2011, the patient delivered a 7 pounds 4 ounce female infant vaginally.  PHYSICAL EXAMINATION:  VITAL SIGNS:  On admission, temperature 98.6, pulse 98, respirations 20, blood pressure 119/57.  HEART:  Normal size and sounds.  No murmurs.  LUNGS:  Clear to auscultation.  Fundal height at her last visit was 40 cm.  On my arrival, the cervix was a rim dilated, vertex at +1 station.  ADMITTING IMPRESSION:  Intrauterine pregnancy at 38 weeks and 3 days, premature rupture of the membranes.  We will proceed with labor and delivery.     Malachi Pro. Ambrose Mantle, M.D.     TFH/MEDQ  D:  12/07/2014  T:  12/07/2014  Job:  [id number]

## 2014-12-07 NOTE — MAU Note (Signed)
Noted a gush of clear fluid around 1013. Having some contractions.

## 2014-12-07 NOTE — Consult Note (Signed)
Neonatology Note:  Attendance at Code Apgar:   Our team responded to a Code Apgar call to room # 168 following NSVD, due to infant with apnea. The requesting physician was Dr. Ambrose Mantle. The mother is a G3P1A1 O pos, GBS neg with precipitous labor. ROM occurred 7 hours PTD and the fluid was clear.  At delivery, the baby was slow to cry and need stimulation, given by Dr. Ambrose Mantle. Our team arrived at 3.5 minutes of life, at which time the baby was crying, and we were called off. We had just gotten a few feet down the hallway when we were called back because the infant's HR had dropped to 80. We noted that he appeared dusky, but his HR was > 100 and he was crying. No history of maternal medications that would depress baby's respirations. A pulse oximeter showed the O2 saturation was in the 60s at 6 minutes of life, so we gave BBO2, with prompt increase in O2 saturations. We were able to wean him back to room air by 12 minutes of age. His lungs sounded clear with good air entry and there was no respiratory distress. No murmurs. 1-min Apgar score to be assigned by DR staff; Apgars at 5 and 10 minutes were 8 and 9. I instructed the OB nurse caring for the baby to continue to watch the baby on pulse oximetry and to transfer him to the CN if he started to have O2 saturations below target levels for age.  I spoke with the parents in the DR, then transferred the baby to the Pediatrician's care.   Doretha Sou, MD

## 2014-12-07 NOTE — Progress Notes (Signed)
Patient ID: Tina Anderson, female   DOB: Dec 07, 1985, 29 y.o.   MRN: [id number] Delivery note:  This pt reached full dilatation and pushed reasonably well. There was terminal bradycardia and I was going to place a vacuum but the pt delivered spontaneously LOA over a second degree ML laceration a living female infant . Apgar was 5 at 1 minute. 1 was off for all 5 scoring areas. The NICU was called and the baby was crying when Dr. Joana Reamer arrived She assigned the 5 minute Apgar of 8 and 9 at 5 minutes. The placenta was removed intact and the uterus was normal. The laceration on the perineum was repaired with 3-0 vicryl under local block. There was a right labial laceration that was hemostatic and not repaired. EBL 116 cc's.

## 2014-12-07 NOTE — Progress Notes (Signed)
Patient stated she felt nauseated, then like she was going to "pass out." Patient was in high fowlers position. Patient became pale and clammy. HOB was lowered and patient tilted to R side. Patient stated at that point that she felt better. Color improved.

## 2014-12-07 NOTE — Progress Notes (Signed)
Dr Ambrose Mantle updated on SVE and pt request for epidural at some point.  Orders given to start pitocin at 1 milliunit/min to increase by 2 milliunits/min per protocol, and that pt can have epidural upon maternal request.

## 2014-12-07 NOTE — Anesthesia Procedure Notes (Signed)
Epidural Patient location during procedure: OB  Staffing Anesthesiologist: Jeronica Stlouis Performed by: anesthesiologist   Preanesthetic Checklist Completed: patient identified, site marked, surgical consent, pre-op evaluation, timeout performed, IV checked, risks and benefits discussed and monitors and equipment checked  Epidural Patient position: sitting Prep: DuraPrep Patient monitoring: heart rate, continuous pulse ox and blood pressure Approach: right paramedian Location: L3-L4 Injection technique: LOR saline  Needle:  Needle type: Tuohy  Needle gauge: 17 G Needle length: 9 cm and 9 Needle insertion depth: 6 cm Catheter type: closed end flexible Catheter size: 20 Guage Catheter at skin depth: 11 cm Test dose: negative  Assessment Events: blood not aspirated, injection not painful, no injection resistance, negative IV test and no paresthesia  Additional Notes Patient identified. Risks/Benefits/Options discussed with patient including but not limited to bleeding, infection, nerve damage, paralysis, failed block, incomplete pain control, headache, blood pressure changes, nausea, vomiting, reactions to medication both or allergic, itching and postpartum back pain. Confirmed with bedside nurse the patient's most recent platelet count. Confirmed with patient that they are not currently taking any anticoagulation, have any bleeding history or any family history of bleeding disorders. Patient expressed understanding and wished to proceed. All questions were answered. Sterile technique was used throughout the entire procedure. Please see nursing notes for vital signs. Test dose was given through epidural needle and negative prior to continuing to dose epidural or start infusion. Warning signs of high block given to the patient including shortness of breath, tingling/numbness in hands, complete motor block, or any concerning symptoms with instructions to call for help. Patient was given  instructions on fall risk and not to get out of bed. All questions and concerns addressed with instructions to call with any issues.   

## 2014-12-08 LAB — CBC
HCT: 35.3 % — ABNORMAL LOW (ref 36.0–46.0)
Hemoglobin: 11.6 g/dL — ABNORMAL LOW (ref 12.0–15.0)
MCH: 31.3 pg (ref 26.0–34.0)
MCHC: 32.9 g/dL (ref 30.0–36.0)
MCV: 95.1 fL (ref 78.0–100.0)
Platelets: 201 10*3/uL (ref 150–400)
RBC: 3.71 MIL/uL — ABNORMAL LOW (ref 3.87–5.11)
RDW: 15 % (ref 11.5–15.5)
WBC: 14 10*3/uL — ABNORMAL HIGH (ref 4.0–10.5)

## 2014-12-08 LAB — HIV ANTIBODY (ROUTINE TESTING W REFLEX): HIV SCREEN 4TH GENERATION: NONREACTIVE

## 2014-12-08 LAB — RPR: RPR: NONREACTIVE

## 2014-12-08 NOTE — Lactation Note (Signed)
This note was copied from the chart of Tina Anderson. Lactation Consultation Note  Mom reports nipple tenderness.  Assisted mom with off-center latch and proper alignment.  She reported increased comfort.  Hand expression taught with colostrum visible.  Follow-up tomorrow.  Aware of support groups and OP services. Patient Name: Tina Jesi Jurgens WUJWJ'X Date: 12/08/2014 Reason for consult: Initial assessment   Maternal Data Has patient been taught Hand Expression?: Yes  Feeding Feeding Type: Breast Fed  LATCH Score/Interventions Latch: Grasps breast easily, tongue down, lips flanged, rhythmical sucking.  Audible Swallowing: A few with stimulation  Type of Nipple: Everted at rest and after stimulation  Comfort (Breast/Nipple): Filling, red/small blisters or bruises, mild/mod discomfort     Hold (Positioning): No assistance needed to correctly position infant at breast.  LATCH Score: 8  Lactation Tools Discussed/Used     Consult Status Consult Status: Follow-up Follow-up type: In-patient    Soyla Dryer 12/08/2014, 2:27 PM

## 2014-12-08 NOTE — Progress Notes (Signed)
Patient ID: Tina Anderson, female   DOB: 1986-02-10, 29 y.o.   MRN: [id number] #1 afebrile BP normal HGB stable

## 2014-12-08 NOTE — Anesthesia Postprocedure Evaluation (Signed)
  Anesthesia Post-op Note  Patient: Tina Anderson  Procedure(s) Performed: * No procedures listed *  Patient Location: PACU and Mother/Baby  Anesthesia Type:Epidural  Level of Consciousness: awake, alert  and oriented  Airway and Oxygen Therapy: Patient Spontanous Breathing  Post-op Pain: none  Post-op Assessment: Post-op Vital signs reviewed, Patient's Cardiovascular Status Stable, No headache, No backache, No residual numbness and No residual motor weakness  Post-op Vital Signs: Reviewed and stable  Complications: No apparent anesthesia complications

## 2014-12-09 MED ORDER — IBUPROFEN 600 MG PO TABS: 600.0000 mg | ORAL_TABLET | Freq: Four times a day (QID) | ORAL | Status: DC | PRN

## 2014-12-09 MED ORDER — OXYCODONE-ACETAMINOPHEN 5-325 MG PO TABS: 1.0000 | ORAL_TABLET | Freq: Four times a day (QID) | ORAL | Status: DC | PRN

## 2014-12-09 NOTE — Lactation Note (Signed)
This note was copied from the chart of Tina Anderson. Lactation Consultation Note  Patient Name: Tina Anderson Date: 12/09/2014 Reason for consult: Follow-up assessment  With this experienced breast feeding mom and term baby, now 13 hours old. The baby was latched deeply with strong suckles when I walked in the room. Mom states the baby latched on his won. Basic breast care reviewed with mom, and lactation support groups advised. Mom and dad and baby doing well, and mom knows to call for questions/concerns.    Maternal Data    Feeding Feeding Type: Breast Fed  LATCH Score/Interventions Latch: Grasps breast easily, tongue down, lips flanged, rhythmical sucking. (mom states baby latched on his own)  Audible Swallowing: Spontaneous and intermittent  Type of Nipple: Everted at rest and after stimulation  Comfort (Breast/Nipple): Soft / non-tender     Hold (Positioning): No assistance needed to correctly position infant at breast.  LATCH Score: 10  Lactation Tools Discussed/Used     Consult Status Consult Status: Complete Follow-up type: Call as needed    Alfred Levins 12/09/2014, 10:58 AM

## 2014-12-09 NOTE — Progress Notes (Signed)
Patient ID: Tina Anderson, female   DOB: [birth year], 29 y.o.   MRN: [id number] #[id number] afebrile BP normal HGB stable for d/c

## 2014-12-09 NOTE — Discharge Instructions (Signed)
booklet °

## 2014-12-09 NOTE — Discharge Summary (Signed)
Tina Anderson, Tina Anderson NO.:  [id number]  MEDICAL RECORD NO.:  [id number]  LOCATION:  9144                          [facility]ACILITY:  [facility]  PHYSICIAN:  Malachi Pro. Ambrose Mantle, M.D. DATE OF BIRTH:  1985/12/30  DATE OF ADMISSION:  12/07/2014 DATE OF DISCHARGE:  12/09/2014                              DISCHARGE SUMMARY   HOSPITAL COURSE:  This is a 29 year old female, para 1-0-0-1, gravida 2 at 38 weeks and 3 days, [facility] December 18, 2014, admitted with premature rupture of the membranes for delivery.  On the day of admission, she had spontaneous rupture of membranes.  She came to the hospital, ruptured membranes was confirmed, clear fluid.  After she came to the Labor and Delivery room after being held in MAU for quite some time, she was placed on Pitocin and began to have regular contractions.  She received an epidural and progressed to 4 cm.  She then fairly rapidly progressed to 6 cm and then to full dilatation.  I attended the delivery and the patient had received very little benefit from her epidural and was quite out of control.  There was some fetal bradycardia in the minute or 2 prior to delivery and I was going to place a vacuum that she went ahead and pushed the baby out.  The baby was delivered vertex without any shoulder dystocia.  Apgar was 5 at 1 minute, rapidly became 9.  Dr. Joana Reamer was called from Neonatology, she attended the baby and she assigned Apgars of 8 at 5 and 9 at 10 minutes.  Postpartum, the patient did very well and was discharged on the second postpartum day.  Initial hemoglobin 13.2, hematocrit 39.4, white count 7500, platelet count 239,000.  Followup hemoglobin 11.6.  FINAL DIAGNOSES:  Intrauterine pregnancy 38 weeks and 3 days, delivered vertex.  Premature rupture of the membranes.  OPERATION:  Spontaneous delivery, LOA, repair of second-degree midline laceration.  FINAL CONDITION:  Improved.  INSTRUCTIONS:  Include our regular discharge  instruction booklet as well as the after-visit summary.  Prescription for Motrin 600 mg 30 tablets, 1 every 6 hours as needed for pain and Percocet 5/325, 12 tablets, 1 every 6 hours as needed for pain.  She was also given a prescription for breast pump.  She is to continue her prenatal vitamins and return to the office in 6 weeks for followup examination.     Malachi Pro. Ambrose Mantle, M.D.     TFH/MEDQ  D:  12/09/2014  T:  12/09/2014  Job:  [id number]

## 2015-07-25 ENCOUNTER — Encounter: Payer: Self-pay | Admitting: Podiatry

## 2015-07-25 ENCOUNTER — Ambulatory Visit (INDEPENDENT_AMBULATORY_CARE_PROVIDER_SITE_OTHER): Payer: BLUE CROSS/BLUE SHIELD | Admitting: Podiatry

## 2015-07-25 VITALS — BP 127/97 | HR 74 | Resp 16 | Ht 65.5 in | Wt 181.0 lb

## 2015-07-25 DIAGNOSIS — L600 Ingrowing nail: Secondary | ICD-10-CM | POA: Diagnosis not present

## 2015-07-25 DIAGNOSIS — M722 Plantar fascial fibromatosis: Secondary | ICD-10-CM

## 2015-07-25 NOTE — Patient Instructions (Signed)

## 2015-07-25 NOTE — Progress Notes (Signed)
Subjective:     Patient ID: Tina Anderson, female   DOB: 11-28-1985, 30 y.o.   MRN: [id number]  HPI patient presents with chronic ingrown toenail deformities of the big toe both feet with pain of several months duration   Review of Systems  All other systems reviewed and are negative.      Objective:   Physical Exam  Constitutional: She is oriented to person, place, and time.  Cardiovascular: Intact distal pulses.   Musculoskeletal: Normal range of motion.  Neurological: She is oriented to person, place, and time.  Skin: Skin is warm.  Nursing note and vitals reviewed.  neurovascular status intact with muscle strength is adequate with pain and incurvation of the hallux nails bilateral lateral border that are irritated with pressure and make it hard for the patient to wear shoe gear comfortably. Good digital perfusion is noted and well oriented 3     Assessment:     Ingrown toenail deformity hallux bilateral lateral border    Plan:     H&P condition reviewed with patient and recommended removal of the lateral borders. Today I infiltrated each hallux 60 mg Xylocaine Marcaine mixture remove the lateral borders exposed matrix and applied phenol 3 applications 30 seconds followed by alcohol lavage and sterile dressing. Gave instructions on soaks and reappoint

## 2015-07-25 NOTE — Progress Notes (Signed)
   Subjective:    Patient ID: Tina Anderson, female    DOB: [birth year], 29 y.o.   MRN: [id number]  HPI Patient presents with a bilateral nail problem; great toes-lateral side. Pt stated, "Got a pedicure 1.5 months ago and got ingrown toenail from that; pus came out last week"; soaked in epsom salt with no relief.  Patient had a baby 7 months ago; pt stated, "nursing baby".   Review of Systems  Constitutional: Positive for unexpected weight change.  All other systems reviewed and are negative.      Objective:   Physical Exam        Assessment & Plan:

## 2015-07-28 ENCOUNTER — Telehealth: Payer: Self-pay | Admitting: *Deleted

## 2015-07-28 NOTE — Telephone Encounter (Signed)
Pt states had toenail surgery on both feet, and the right is too uncomfortable to wear a steel shoed to work, needs a note for work to be out of work until [employer] account executivetomorrow.  I told pt she could pick up the note at her convenience.

## 2015-08-05 ENCOUNTER — Telehealth: Payer: Self-pay | Admitting: *Deleted

## 2015-08-05 NOTE — Telephone Encounter (Signed)
Called patient at [phone] (Cell #) to check to see how they were doing from their ingrown toenail procedure that was performed on Friday, July 25, 2015. Pt stated, "Toes feel better and has no pain".

## 2015-10-31 ENCOUNTER — Ambulatory Visit: Payer: BLUE CROSS/BLUE SHIELD | Admitting: Podiatry

## 2015-11-03 ENCOUNTER — Ambulatory Visit: Payer: Medicaid Other | Admitting: Podiatry

## 2015-11-12 ENCOUNTER — Ambulatory Visit: Payer: Self-pay | Admitting: Podiatry

## 2015-12-08 LAB — OB RESULTS CONSOLE RUBELLA ANTIBODY, IGM: Rubella: IMMUNE

## 2015-12-08 LAB — OB RESULTS CONSOLE ABO/RH: RH TYPE: POSITIVE

## 2015-12-08 LAB — OB RESULTS CONSOLE HEPATITIS B SURFACE ANTIGEN: Hepatitis B Surface Ag: NEGATIVE

## 2015-12-08 LAB — OB RESULTS CONSOLE HIV ANTIBODY (ROUTINE TESTING): HIV: NONREACTIVE

## 2015-12-08 LAB — OB RESULTS CONSOLE RPR: RPR: NONREACTIVE

## 2015-12-08 LAB — OB RESULTS CONSOLE ANTIBODY SCREEN: Antibody Screen: NEGATIVE

## 2015-12-09 LAB — OB RESULTS CONSOLE GC/CHLAMYDIA
CHLAMYDIA, DNA PROBE: NEGATIVE
GC PROBE AMP, GENITAL: NEGATIVE

## 2016-01-27 ENCOUNTER — Other Ambulatory Visit: Payer: Self-pay | Admitting: Gastroenterology

## 2016-01-27 DIAGNOSIS — R112 Nausea with vomiting, unspecified: Secondary | ICD-10-CM

## 2016-01-27 DIAGNOSIS — R1013 Epigastric pain: Secondary | ICD-10-CM

## 2016-02-02 ENCOUNTER — Other Ambulatory Visit (HOSPITAL_COMMUNITY): Payer: Self-pay | Admitting: Obstetrics and Gynecology

## 2016-02-02 DIAGNOSIS — M5441 Lumbago with sciatica, right side: Secondary | ICD-10-CM

## 2016-02-02 DIAGNOSIS — R109 Unspecified abdominal pain: Secondary | ICD-10-CM

## 2016-02-03 ENCOUNTER — Other Ambulatory Visit (HOSPITAL_COMMUNITY): Payer: Self-pay | Admitting: Obstetrics and Gynecology

## 2016-02-03 ENCOUNTER — Ambulatory Visit (HOSPITAL_COMMUNITY)
Admission: RE | Admit: 2016-02-03 | Discharge: 2016-02-03 | Disposition: A | Discharge status: Home / Self Care | Payer: 59 | Source: Ambulatory Visit | Attending: Obstetrics and Gynecology | Admitting: Obstetrics and Gynecology

## 2016-02-03 DIAGNOSIS — K8020 Calculus of gallbladder without cholecystitis without obstruction: Secondary | ICD-10-CM | POA: Insufficient documentation

## 2016-02-03 DIAGNOSIS — N200 Calculus of kidney: Secondary | ICD-10-CM | POA: Diagnosis not present

## 2016-02-03 DIAGNOSIS — R109 Unspecified abdominal pain: Secondary | ICD-10-CM | POA: Diagnosis present

## 2016-02-03 DIAGNOSIS — R1013 Epigastric pain: Secondary | ICD-10-CM | POA: Diagnosis present

## 2016-02-09 ENCOUNTER — Other Ambulatory Visit: Payer: BLUE CROSS/BLUE SHIELD

## 2016-02-12 ENCOUNTER — Other Ambulatory Visit: Payer: BLUE CROSS/BLUE SHIELD

## 2016-05-10 NOTE — L&D Delivery Note (Addendum)
Patient complete and pushing. SVD of viable female infant over intact perineum. No nuchal cord. Infant delivered to mom's abdomen. Delayed cord clamping x 1 minute. Cord clamped x 2, cut. Spontaneous cry heard. Weight and Apgars pending. Cord blood obtained.  Dr. Mindi SlickerBanga arrived prior to delivery of the placenta and took over the procedure.   Pt progressed quickly after pitocin started and epidural administerd to deliver- Delivery as above by Dr Shawnie PonsPratt. I arrived 2-3 mins after infant delivered. Placenta delivered easily intact and shultz. Second degree perineal laceration was repaired with 2-0 vicryl after application of local anesthesia. Pt tolerated well FF and below umbilicus by 1cm EBL  200ml Apgars of 8 and 9 Weight pending Baby to skin to skin with parents  Circumcision discussed and they plan to have done while in hospital. All questions addressed

## 2016-05-14 DIAGNOSIS — Z23 Encounter for immunization: Secondary | ICD-10-CM | POA: Diagnosis not present

## 2016-06-11 LAB — OB RESULTS CONSOLE GBS: STREP GROUP B AG: NEGATIVE

## 2016-07-12 ENCOUNTER — Inpatient Hospital Stay (HOSPITAL_COMMUNITY)
Admission: AD | Admit: 2016-07-12 | Discharge: 2016-07-14 | DRG: 775 | Disposition: A | Discharge status: Home / Self Care | Payer: 59 | Source: Ambulatory Visit | Attending: Obstetrics and Gynecology | Admitting: Obstetrics and Gynecology

## 2016-07-12 ENCOUNTER — Encounter (HOSPITAL_COMMUNITY): Payer: Self-pay | Admitting: *Deleted

## 2016-07-12 ENCOUNTER — Inpatient Hospital Stay (HOSPITAL_COMMUNITY): Payer: 59 | Admitting: Anesthesiology

## 2016-07-12 DIAGNOSIS — O9962 Diseases of the digestive system complicating childbirth: Secondary | ICD-10-CM | POA: Diagnosis present

## 2016-07-12 DIAGNOSIS — K8020 Calculus of gallbladder without cholecystitis without obstruction: Secondary | ICD-10-CM | POA: Diagnosis present

## 2016-07-12 DIAGNOSIS — O4202 Full-term premature rupture of membranes, onset of labor within 24 hours of rupture: Secondary | ICD-10-CM | POA: Diagnosis not present

## 2016-07-12 DIAGNOSIS — Z3A39 39 weeks gestation of pregnancy: Secondary | ICD-10-CM | POA: Diagnosis not present

## 2016-07-12 DIAGNOSIS — O4290 Premature rupture of membranes, unspecified as to length of time between rupture and onset of labor, unspecified weeks of gestation: Secondary | ICD-10-CM | POA: Diagnosis present

## 2016-07-12 LAB — CBC
HEMATOCRIT: 37 % (ref 36.0–46.0)
Hemoglobin: 12.5 g/dL (ref 12.0–15.0)
MCH: 32.1 pg (ref 26.0–34.0)
MCHC: 33.8 g/dL (ref 30.0–36.0)
MCV: 95.1 fL (ref 78.0–100.0)
Platelets: 230 10*3/uL (ref 150–400)
RBC: 3.89 MIL/uL (ref 3.87–5.11)
RDW: 14.7 % (ref 11.5–15.5)
WBC: 9.2 10*3/uL (ref 4.0–10.5)

## 2016-07-12 LAB — TYPE AND SCREEN
ABO/RH(D): O POS
ANTIBODY SCREEN: NEGATIVE

## 2016-07-12 LAB — POCT FERN TEST: POCT FERN TEST: POSITIVE

## 2016-07-12 MED ORDER — PHENYLEPHRINE 40 MCG/ML (10ML) SYRINGE FOR IV PUSH (FOR BLOOD PRESSURE SUPPORT)
80.0000 ug | PREFILLED_SYRINGE | INTRAVENOUS | Status: DC | PRN
  Filled 2016-07-12: qty 10
  Filled 2016-07-12: qty 5

## 2016-07-12 MED ORDER — EPHEDRINE 5 MG/ML INJ
10.0000 mg | INTRAVENOUS | Status: DC | PRN
  Filled 2016-07-12: qty 4

## 2016-07-12 MED ORDER — OXYTOCIN 40 UNITS IN LACTATED RINGERS INFUSION - SIMPLE MED
1.0000 m[IU]/min | INTRAVENOUS | Status: DC
  Administered 2016-07-12: 2 m[IU]/min via INTRAVENOUS
  Filled 2016-07-12: qty 1000

## 2016-07-12 MED ORDER — OXYTOCIN 40 UNITS IN LACTATED RINGERS INFUSION - SIMPLE MED: 2.5000 [IU]/h | INTRAVENOUS | Status: DC

## 2016-07-12 MED ORDER — ACETAMINOPHEN 325 MG PO TABS: 650.0000 mg | ORAL_TABLET | ORAL | Status: DC | PRN

## 2016-07-12 MED ORDER — IBUPROFEN 600 MG PO TABS
600.0000 mg | ORAL_TABLET | Freq: Four times a day (QID) | ORAL | Status: DC
  Administered 2016-07-12 – 2016-07-14 (×6): 600 mg via ORAL
  Filled 2016-07-12 (×6): qty 1

## 2016-07-12 MED ORDER — PHENYLEPHRINE 40 MCG/ML (10ML) SYRINGE FOR IV PUSH (FOR BLOOD PRESSURE SUPPORT)
80.0000 ug | PREFILLED_SYRINGE | INTRAVENOUS | Status: DC | PRN
  Filled 2016-07-12: qty 5

## 2016-07-12 MED ORDER — ONDANSETRON HCL 4 MG/2ML IJ SOLN: 4.0000 mg | Freq: Four times a day (QID) | INTRAMUSCULAR | Status: DC | PRN

## 2016-07-12 MED ORDER — LACTATED RINGERS IV SOLN
500.0000 mL | Freq: Once | INTRAVENOUS | Status: AC
  Administered 2016-07-12: 500 mL via INTRAVENOUS

## 2016-07-12 MED ORDER — LACTATED RINGERS IV SOLN
500.0000 mL | INTRAVENOUS | Status: DC | PRN
  Administered 2016-07-12: 500 mL via INTRAVENOUS

## 2016-07-12 MED ORDER — OXYTOCIN BOLUS FROM INFUSION
500.0000 mL | Freq: Once | INTRAVENOUS | Status: AC
  Administered 2016-07-12: 500 mL via INTRAVENOUS

## 2016-07-12 MED ORDER — LACTATED RINGERS IV SOLN
INTRAVENOUS | Status: DC
  Administered 2016-07-12 (×2): via INTRAVENOUS

## 2016-07-12 MED ORDER — BUTORPHANOL TARTRATE 1 MG/ML IJ SOLN: 1.0000 mg | INTRAMUSCULAR | Status: DC | PRN

## 2016-07-12 MED ORDER — SOD CITRATE-CITRIC ACID 500-334 MG/5ML PO SOLN
30.0000 mL | ORAL | Status: DC | PRN
Start: 2016-07-12 — End: 2016-07-13
  Administered 2016-07-12: 30 mL via ORAL
  Filled 2016-07-12: qty 15

## 2016-07-12 MED ORDER — LIDOCAINE HCL (PF) 1 % IJ SOLN
30.0000 mL | INTRAMUSCULAR | Status: DC | PRN
  Administered 2016-07-12: 30 mL via SUBCUTANEOUS
  Filled 2016-07-12: qty 30

## 2016-07-12 MED ORDER — LIDOCAINE HCL (PF) 1 % IJ SOLN
INTRAMUSCULAR | Status: DC | PRN
  Administered 2016-07-12 (×2): 6 mL via EPIDURAL

## 2016-07-12 MED ORDER — FENTANYL 2.5 MCG/ML BUPIVACAINE 1/10 % EPIDURAL INFUSION (WH - ANES)
14.0000 mL/h | INTRAMUSCULAR | Status: DC | PRN
  Administered 2016-07-12: 14 mL/h via EPIDURAL
  Filled 2016-07-12: qty 100

## 2016-07-12 MED ORDER — DIPHENHYDRAMINE HCL 50 MG/ML IJ SOLN: 12.5000 mg | INTRAMUSCULAR | Status: DC | PRN

## 2016-07-12 MED ORDER — TERBUTALINE SULFATE 1 MG/ML IJ SOLN
0.2500 mg | Freq: Once | INTRAMUSCULAR | Status: DC | PRN
  Filled 2016-07-12: qty 1

## 2016-07-12 NOTE — Progress Notes (Signed)
Dr Mindi SlickerBanga notified of pt's ROM, + FERN, VE, orders received to admit pt

## 2016-07-12 NOTE — MAU Note (Signed)
Water broke, pop/gush of clear fluid at 3:30, continues to leak since then. No bleeding. Is contracting, are a little more intense now. Was 2-3 when  Last checked

## 2016-07-12 NOTE — Anesthesia Preprocedure Evaluation (Signed)
Anesthesia Evaluation  Patient identified by MRN, date of birth, ID band Patient awake    Reviewed: Allergy & Precautions, H&P , NPO status , Patient's Chart, lab work & pertinent test results  History of Anesthesia Complications Negative for: history of anesthetic complications  Airway Mallampati: II  TM Distance: >3 FB Neck ROM: full    Dental no notable dental hx.    Pulmonary    Pulmonary exam normal breath sounds clear to auscultation       Cardiovascular negative cardio ROS Normal cardiovascular exam Rate:Normal     Neuro/Psych negative neurological ROS  negative psych ROS   GI/Hepatic negative GI ROS, Neg liver ROS,   Endo/Other  negative endocrine ROS  Renal/GU   negative genitourinary   Musculoskeletal   Abdominal (+) + obese,   Peds  Hematology negative hematology ROS (+)   Anesthesia Other Findings   Reproductive/Obstetrics (+) Pregnancy                             Anesthesia Physical  Anesthesia Plan  ASA: II  Anesthesia Plan: Epidural   Post-op Pain Management:    Induction:   Airway Management Planned:   Additional Equipment:   Intra-op Plan:   Post-operative Plan:   Informed Consent: I have reviewed the patients History and Physical, chart, labs and discussed the procedure including the risks, benefits and alternatives for the proposed anesthesia with the patient or authorized representative who has indicated his/her understanding and acceptance.     Plan Discussed with:   Anesthesia Plan Comments:         Anesthesia Quick Evaluation

## 2016-07-12 NOTE — Lactation Note (Signed)
This note was copied from a baby's chart. Lactation Consultation Note  Patient Name: Boy Homero FellersCourtney Pall WUJWJ'XToday's Date: 07/12/2016 Reason for consult: Initial assessment Baby at 30 minutes of life. Experienced bf mom denies breast or nipple pain. She feels like she "forgot how to bf". With her 2nd child bf went well for the first 2wk then she had cracked bleeding nipples. She was able to work on positioning and the issue resolved. Discussed baby behavior, feeding frequency, baby belly size, voids, wt loss, breast changes, and nipple care. Demonstrated manual expression, colostrum noted bilaterally. Given lactation handouts. Aware of OP services and support group.      Maternal Data Has patient been taught Hand Expression?: Yes Does the patient have breastfeeding experience prior to this delivery?: Yes  Feeding Feeding Type: Breast Fed  LATCH Score/Interventions Latch: Grasps breast easily, tongue down, lips flanged, rhythmical sucking.  Audible Swallowing: A few with stimulation Intervention(s): Skin to skin;Hand expression;Alternate breast massage  Type of Nipple: Everted at rest and after stimulation  Comfort (Breast/Nipple): Soft / non-tender     Hold (Positioning): Full assist, staff holds infant at breast Intervention(s): Position options  LATCH Score: 7  Lactation Tools Discussed/Used     Consult Status Consult Status: Follow-up Date: 07/13/16 Follow-up type: In-patient    Rulon Eisenmengerlizabeth E Koa Palla 07/12/2016, 10:53 PM

## 2016-07-12 NOTE — MAU Provider Note (Signed)
Bedside US confirms vertex   Duane LopeJennifer I Adelaido Nicklaus, NP 07/12/2016 5:28 PM

## 2016-07-12 NOTE — Anesthesia Pain Management Evaluation Note (Signed)
  CRNA Pain Management Visit Note  Patient: Tina Anderson, 31 y.o., female  "Hello I am a member of the anesthesia team at [facility]. We have an anesthesia team available at all times to provide care throughout the hospital, including epidural management and anesthesia for C-section. I don't know your plan for the delivery whether it a natural birth, water birth, IV sedation, nitrous supplementation, doula or epidural, but we want to meet your pain goals."   1.Was your pain managed to your expectations on prior hospitalizations?   Yes   2.What is your expectation for pain management during this hospitalization?     Epidural  3.How can we help you reach that goal? Just got epidural.   Record the patient's initial score and the patient's pain goal.   Pain: 7 Asked RN to give patient a bolus because she is still very uncomfortable with contractions. Asked nurse to call anesthesia if bolus does not work.   Pain Goal: 5 [facility] wants you to be able to say your pain was always managed very well.  Avri Paiva 07/12/2016

## 2016-07-12 NOTE — Anesthesia Procedure Notes (Signed)
Epidural Patient location during procedure: OB Start time: 07/12/2016 9:13 PM End time: 07/12/2016 9:16 PM  Staffing Anesthesiologist: Leilani AbleHATCHETT, Dezarae Mcclaran Performed: anesthesiologist   Preanesthetic Checklist Completed: patient identified, surgical consent, pre-op evaluation, timeout performed, IV checked, risks and benefits discussed and monitors and equipment checked  Epidural Patient position: sitting Prep: site prepped and draped and DuraPrep Patient monitoring: continuous pulse ox and blood pressure Approach: midline Location: L3-L4 Injection technique: LOR air  Needle:  Needle type: Tuohy  Needle gauge: 17 G Needle length: 9 cm and 9 Needle insertion depth: 6 cm Catheter type: closed end flexible Catheter size: 19 Gauge Catheter at skin depth: 11 cm Test dose: negative and Other  Assessment Sensory level: T9 Events: blood not aspirated, injection not painful, no injection resistance, negative IV test and no paresthesia  Additional Notes Reason for block:procedure for pain

## 2016-07-12 NOTE — H&P (Signed)
Tina Anderson is a 31 y.o. female presenting for Leakage of fluid; confirmed SROM - clear that occurred at about 1500 today. Pt reports mild irregular contractions only. Prenatal care complicated by cholelithiasis with no obstruction otherwise benign. Dating per LMP and confirmed with 8week US. She declined genetic screening  OB History    Gravida Para Term Preterm AB Living   4 2 2   1 2    SAB TAB Ectopic Multiple Live Births   1     0 2     Past Medical History:  Diagnosis Date  . Asthma    childhood  . Kidney calculus   . Renal disorder   . Vaginal Pap smear, abnormal    History reviewed. No pertinent surgical history. Family History: family history is not on file. Social History:  reports that she has never smoked. She has never used smokeless tobacco. She reports that she drinks alcohol. She reports that she does not use drugs.     Maternal Diabetes: No Genetic Screening: Declined Maternal Ultrasounds/Referrals: Normal Fetal Ultrasounds or other Referrals:  None Maternal Substance Abuse:  No Significant Maternal Medications:  None Significant Maternal Lab Results:  Lab values include: Group B Strep negative Other Comments:  None  Review of Systems  Constitutional: Negative for chills, fever, malaise/fatigue and weight loss.  Respiratory: Negative for shortness of breath.   Cardiovascular: Negative for chest pain.  Gastrointestinal: Positive for abdominal pain. Negative for heartburn, nausea and vomiting.  Musculoskeletal: Negative for myalgias.  Skin: Negative for itching and rash.  Neurological: Negative for dizziness and headaches.  Endo/Heme/Allergies: Does not bruise/bleed easily.  Psychiatric/Behavioral: Negative for depression, hallucinations, substance abuse and suicidal ideas. The patient is nervous/anxious.    Maternal Medical History:  Reason for admission: Rupture of membranes.  Nausea.  Contractions: Frequency: irregular.   Perceived severity is  mild.    Fetal activity: Perceived fetal activity is normal.   Last perceived fetal movement was within the past hour.    Prenatal complications: Cholelithiasis without obstruction  Prenatal Complications - Diabetes: none.    Dilation: 3.5 Effacement (%): 70 Station: -3 Exam by:: Tina Morris RN Blood pressure 136/87, pulse 108, temperature 98.5 F (36.9 C), resp. rate 18, weight 206 lb 12 oz (93.8 kg), SpO2 99 %, unknown if currently breastfeeding. Maternal Exam:  Uterine Assessment: Contraction strength is mild.  Contraction frequency is irregular.   Abdomen: Patient reports generalized tenderness.  Estimated fetal weight is AGA.   Fetal presentation: vertex  Introitus: Normal vulva. Normal vagina.  Ferning test: positive.  Nitrazine test: positive. Amniotic fluid character: clear.  Pelvis: adequate for delivery.   Cervix: Cervix evaluated by digital exam.     Physical Exam  Constitutional: She is oriented to person, place, and time. She appears well-developed and well-nourished.  HENT:  Head: Normocephalic.  Neck: Normal range of motion.  Cardiovascular: Normal rate.   Respiratory: Effort normal and breath sounds normal.  GI: There is generalized tenderness.  Genitourinary: Vagina normal and uterus normal.  Musculoskeletal: Normal range of motion.  Neurological: She is alert and oriented to person, place, and time.  Skin: Skin is warm and dry.  Psychiatric: She has a normal mood and affect. Her behavior is normal. Judgment and thought content normal.    Prenatal labs: ABO, Rh: O/Positive/-- (07/31 0000) Antibody: Negative (07/31 0000) Rubella: Immune (07/31 0000) RPR: Nonreactive (07/31 0000)  HBsAg: Negative (07/31 0000)  HIV: Non-reactive (07/31 0000)  GBS: Negative (02/02 0000)  Assessment/Plan: [id number] with SROM admitted for labor management Will augment with pitocin if no spontaneous active labor Pain meds ordered: nitrous/stadol/epidural prn GBS  neg Anticipate svd  Sharol Given Banga 07/12/2016, 5:46 PM

## 2016-07-13 ENCOUNTER — Encounter (HOSPITAL_COMMUNITY): Payer: Self-pay | Admitting: *Deleted

## 2016-07-13 LAB — CBC
HCT: 34.8 % — ABNORMAL LOW (ref 36.0–46.0)
HEMOGLOBIN: 11.7 g/dL — AB (ref 12.0–15.0)
MCH: 32 pg (ref 26.0–34.0)
MCHC: 33.6 g/dL (ref 30.0–36.0)
MCV: 95.1 fL (ref 78.0–100.0)
Platelets: 214 10*3/uL (ref 150–400)
RBC: 3.66 MIL/uL — AB (ref 3.87–5.11)
RDW: 14.8 % (ref 11.5–15.5)
WBC: 13.8 10*3/uL — AB (ref 4.0–10.5)

## 2016-07-13 LAB — RPR: RPR Ser Ql: NONREACTIVE

## 2016-07-13 MED ORDER — DIBUCAINE 1 % RE OINT: 1.0000 "application " | TOPICAL_OINTMENT | RECTAL | Status: DC | PRN

## 2016-07-13 MED ORDER — ACETAMINOPHEN 325 MG PO TABS
650.0000 mg | ORAL_TABLET | ORAL | Status: DC | PRN
  Administered 2016-07-13 (×3): 650 mg via ORAL
  Filled 2016-07-13 (×3): qty 2

## 2016-07-13 MED ORDER — OXYCODONE HCL 5 MG PO TABS: 10.0000 mg | ORAL_TABLET | ORAL | Status: DC | PRN

## 2016-07-13 MED ORDER — TETANUS-DIPHTH-ACELL PERTUSSIS 5-2.5-18.5 LF-MCG/0.5 IM SUSP: 0.5000 mL | Freq: Once | INTRAMUSCULAR | Status: DC

## 2016-07-13 MED ORDER — SENNOSIDES-DOCUSATE SODIUM 8.6-50 MG PO TABS
2.0000 | ORAL_TABLET | ORAL | Status: DC
  Administered 2016-07-13 (×2): 2 via ORAL
  Filled 2016-07-13 (×2): qty 2

## 2016-07-13 MED ORDER — WITCH HAZEL-GLYCERIN EX PADS: 1.0000 "application " | MEDICATED_PAD | CUTANEOUS | Status: DC | PRN

## 2016-07-13 MED ORDER — BENZOCAINE-MENTHOL 20-0.5 % EX AERO
1.0000 "application " | INHALATION_SPRAY | CUTANEOUS | Status: DC | PRN
  Administered 2016-07-13: 1 via TOPICAL
  Filled 2016-07-13: qty 56

## 2016-07-13 MED ORDER — ONDANSETRON HCL 4 MG/2ML IJ SOLN: 4.0000 mg | INTRAMUSCULAR | Status: DC | PRN

## 2016-07-13 MED ORDER — COCONUT OIL OIL: 1.0000 "application " | TOPICAL_OIL | Status: DC | PRN

## 2016-07-13 MED ORDER — ONDANSETRON HCL 4 MG PO TABS: 4.0000 mg | ORAL_TABLET | ORAL | Status: DC | PRN

## 2016-07-13 MED ORDER — SIMETHICONE 80 MG PO CHEW: 80.0000 mg | CHEWABLE_TABLET | ORAL | Status: DC | PRN

## 2016-07-13 MED ORDER — DIPHENHYDRAMINE HCL 25 MG PO CAPS: 25.0000 mg | ORAL_CAPSULE | Freq: Four times a day (QID) | ORAL | Status: DC | PRN

## 2016-07-13 MED ORDER — OXYCODONE HCL 5 MG PO TABS: 5.0000 mg | ORAL_TABLET | ORAL | Status: DC | PRN

## 2016-07-13 MED ORDER — PRENATAL MULTIVITAMIN CH
1.0000 | ORAL_TABLET | Freq: Every day | ORAL | Status: DC
  Administered 2016-07-13: 1 via ORAL
  Filled 2016-07-13: qty 1

## 2016-07-13 MED ORDER — ZOLPIDEM TARTRATE 5 MG PO TABS: 5.0000 mg | ORAL_TABLET | Freq: Every evening | ORAL | Status: DC | PRN

## 2016-07-13 NOTE — Anesthesia Postprocedure Evaluation (Signed)
Anesthesia Post Note  Patient: Tina Anderson  Procedure(s) Performed: * No procedures listed *  Patient location during evaluation: Mother Baby Anesthesia Type: Epidural Level of consciousness: awake and alert and oriented Pain management: pain level controlled Vital Signs Assessment: post-procedure vital signs reviewed and stable Respiratory status: spontaneous breathing and nonlabored ventilation Cardiovascular status: stable Postop Assessment: no headache, epidural receding, patient able to bend at knees, adequate PO intake, no backache and no signs of nausea or vomiting Anesthetic complications: no        Last Vitals:  Vitals:   07/13/16 0115 07/13/16 0516  BP: (!) 119/57 (!) 123/55  Pulse: 76 75  Resp: 17 18  Temp: 36.5 C 36.8 C    Last Pain:  Vitals:   07/13/16 0808  TempSrc:   PainSc: 7    Pain Goal:                 Laban EmperorMalinova,Hervey Wedig Hristova

## 2016-07-13 NOTE — Progress Notes (Signed)
Post Partum Day 1 Subjective: no complaints, up ad lib and tolerating PO  Objective: Blood pressure (!) 123/55, pulse 75, temperature 98.3 F (36.8 C), temperature source Oral, resp. rate 18, height 5\' 5"  (1.651 m), weight 93 kg (205 lb), SpO2 99 %, unknown if currently breastfeeding.  Physical Exam:  General: alert and cooperative Lochia: appropriate Uterine Fundus: firm    Recent Labs  07/12/16 1835 07/13/16 0639  HGB 12.5 11.7*  HCT 37.0 34.8*    Assessment/Plan: Plan for discharge tomorrow  D/w pt circumcision and desire to proceed.  Waiting on peds to see baby to proceed.   LOS: 1 day   Miata Culbreth W 07/13/2016, 8:54 AM

## 2016-07-13 NOTE — Lactation Note (Signed)
This note was copied from a baby's chart. Lactation Consultation Note Follow up visit at 21 hours of age.  Mom reports good feedings and denies pain with latching.  LC noted 17 recorded feedings in lifetime with  4 voids and 5 stools.  LC discussed keeping baby active during feeding as mom reports she has not been as consistent with that. LC discussed hand expression with instructions and mom can hand express drops easily.  Baby is asleep post circ at bedside with mom.   LC discussed cross cradle hold and football holds and to compress breasts during feeding to assure baby is eating well.  Mom denies further concerns.  LATCH score by RN today "8."  Patient Name: Tina Anderson Reason for consult: Follow-up assessment   Maternal Data Has patient been taught Hand Expression?: Yes  Feeding Feeding Type: Breast Fed Length of feed: 15 min  LATCH Score/Interventions                Intervention(s): Breastfeeding basics reviewed;Position options     Lactation Tools Discussed/Used     Consult Status Consult Status: Follow-up Date: 07/14/16 Follow-up type: In-patient    Shaurya Rawdon, Arvella MerlesJana Lynn Anderson, 7:14 PM

## 2016-07-14 MED ORDER — PRENATAL MULTIVITAMIN CH: 1.0000 | ORAL_TABLET | Freq: Every day | ORAL | 3 refills | Status: DC

## 2016-07-14 MED ORDER — IBUPROFEN 800 MG PO TABS: 800.0000 mg | ORAL_TABLET | Freq: Three times a day (TID) | ORAL | 1 refills | Status: DC | PRN

## 2016-07-14 NOTE — Lactation Note (Signed)
This note was copied from a baby's chart. Lactation Consultation Note  Patient Name: Boy Homero FellersCourtney Reinheimer VHQIO'NToday's Date: 07/14/2016 Reason for consult: Follow-up assessment  Visited with Mom on day of discharge, baby 10136 hrs old.  Mom lying partly on her side, with baby in cradle, with his head lower than his body.  Mom not supporting breast, or supporting baby's head.  Latch noted to be shallow.  Offered to assist with latching.  Mom sat up, and assisted with cross cradle hold.  Basic teaching done on importance of controlling latch, and sandwich breast to facilitate a deeper latch onto breast.  Baby latched easily, demonstrated alternate breast compression to increase milk transfer.  Identified swallowing.  Encouraged STS and cue based feedings, goal of 8-12 feedings per 24 hrs.  Engorgement prevention and treatment discussed. Reminded Mom of OP lactation resources and encouraged her to call prn.    Consult Status Consult Status: Complete Date: 07/14/16 Follow-up type: Call as needed    Judee ClaraSmith, Kenson Groh E 07/14/2016, 10:47 AM

## 2016-07-14 NOTE — Discharge Summary (Signed)
OB Discharge Summary     Patient Name: Tina Anderson DOB: 11/12/1985 MRN: [id number]  Date of admission: 07/12/2016 Delivering MD: Reva BoresPRATT, TANYA S   Date of discharge: 07/14/2016  Admitting diagnosis: 39.6w water broke, ctx Intrauterine pregnancy: 2468w6d     Secondary diagnosis:  Active Problems:   Delayed delivery after SROM (spontaneous rupture of membranes)   Postpartum care following vaginal delivery  Additional problems: N/A     Discharge diagnosis: Term Pregnancy Delivered                                                                                                Post partum procedures:N/A  Augmentation: N/A  Complications: None  Hospital course:  Onset of Labor With Vaginal Delivery     31 y.o. yo [id number] at 5468w6d was admitted in Active Labor on 07/12/2016. Patient had an uncomplicated labor course as follows:  Membrane Rupture Time/Date: 3:30 PM ,07/12/2016   Intrapartum Procedures: Episiotomy: None [1]                                         Lacerations:  2nd degree [3]  Patient had a delivery of a Viable infant. 07/12/2016  Information for the patient's newborn:  Antoine PocheUllah, Boy Tina Anderson [[id number]][[id number]]  Delivery Method: Vaginal, Spontaneous Delivery (Filed from Delivery Summary)    Pateint had an uncomplicated postpartum course.  She is ambulating, tolerating a regular diet, passing flatus, and urinating well. Patient is discharged home in stable condition on 07/14/16.   Physical exam  Vitals:   07/13/16 0115 07/13/16 0516 07/13/16 1828 07/14/16 0645  BP: (!) 119/57 (!) 123/55 (!) 133/57 127/64  Pulse: 76 75 83 75  Resp: 17 18 12 18   Temp: 97.7 F (36.5 C) 98.3 F (36.8 C) 97.5 F (36.4 C) 98.4 F (36.9 C)  TempSrc: Oral Oral Oral Oral  SpO2:      Weight:      Height:       General: alert and no distress Lochia: appropriate Uterine Fundus: firm  Labs: Lab Results  Component Value Date   WBC 13.8 (H) 07/13/2016   HGB 11.7 (L) 07/13/2016   HCT 34.8 (L)  07/13/2016   MCV 95.1 07/13/2016   PLT 214 07/13/2016   CMP Latest Ref Rng & Units 10/28/2014  Glucose 65 - 99 mg/dL 96  BUN 6 - 20 mg/dL 6  Creatinine 6.960.44 - 2.951.00 mg/dL 2.840.65  Sodium 132135 - 440145 mmol/L 136  Potassium 3.5 - 5.1 mmol/L 4.9  Chloride 101 - 111 mmol/L 107  CO2 22 - 32 mmol/L 23  Calcium 8.9 - 10.3 mg/dL 9.0  Total Protein 6.5 - 8.1 g/dL 6.7  Total Bilirubin 0.3 - 1.2 mg/dL 0.9  Alkaline Phos 38 - 126 U/L 94  AST 15 - 41 U/L 28  ALT 14 - 54 U/L 13(L)    Discharge instruction: per After Visit Summary and "Baby and Me Booklet".  After visit meds:  Allergies as of 07/14/2016  Reactions   Latex Hives   Penicillins Hives, Rash   Has patient had a PCN reaction causing immediate rash, facial/tongue/throat swelling, SOB or lightheadedness with hypotension: Unknown Has patient had a PCN reaction causing severe rash involving mucus membranes or skin necrosis: Unknown Has patient had a PCN reaction that required hospitalization Yes Has patient had a PCN reaction occurring within the last 10 years: No If all of the above answers are "NO", then may proceed with Cephalosporin use.      Medication List    TAKE these medications   acetaminophen 325 MG tablet Commonly known as:  TYLENOL Take 650 mg by mouth every 6 (six) hours as needed for headache.   ibuprofen 800 MG tablet Commonly known as:  ADVIL,MOTRIN Take 1 tablet (800 mg total) by mouth every 8 (eight) hours as needed.   prenatal multivitamin Tabs tablet Take 1 tablet by mouth at bedtime.   ranitidine 150 MG tablet Commonly known as:  ZANTAC Take 150 mg by mouth 2 (two) times daily.       Diet: routine diet  Activity: Advance as tolerated. Pelvic rest for 6 weeks.   Outpatient follow up:6 weeks Follow up Appt:Future Appointments Date Time Provider Department Center  07/21/2016 7:00 AM [facility] None   Follow up Visit:No Follow-up on file.  Postpartum contraception:  Undecided  Newborn Data: Live born female  Birth Weight: 7 lb 1.1 oz (3206 g) APGAR: 8, 9  Baby Feeding: Breast Disposition:home with mother   07/14/2016 Sherian Rein, MD

## 2016-07-14 NOTE — Progress Notes (Signed)
Post Partum Day 2 Subjective: no complaints, up ad lib, voiding, tolerating PO and nl lochia, pain controlled  Objective: Blood pressure 127/64, pulse 75, temperature 98.4 F (36.9 C), temperature source Oral, resp. rate 18, height 5\' 5"  (1.651 m), weight 93 kg (205 lb), SpO2 99 %, unknown if currently breastfeeding.  Physical Exam:  General: alert and no distress Lochia: appropriate Uterine Fundus: firm   Recent Labs  07/12/16 1835 07/13/16 0639  HGB 12.5 11.7*  HCT 37.0 34.8*    Assessment/Plan: Discharge home, Breastfeeding and Lactation consult.  Routine care.  D/C with motrin and PNV, f/u 6 weeks   LOS: 2 days   Anderson, Tina Kalman 07/14/2016, 8:40 AM

## 2016-07-16 ENCOUNTER — Telehealth (HOSPITAL_COMMUNITY): Payer: Self-pay | Admitting: *Deleted

## 2016-07-16 NOTE — Telephone Encounter (Signed)
Preadmission screen  

## 2016-07-21 ENCOUNTER — Inpatient Hospital Stay (HOSPITAL_COMMUNITY): Admission: RE | Admit: 2016-07-21 | Payer: 59 | Source: Ambulatory Visit

## 2016-09-15 DIAGNOSIS — N760 Acute vaginitis: Secondary | ICD-10-CM | POA: Diagnosis not present

## 2016-09-30 DIAGNOSIS — Z0000 Encounter for general adult medical examination without abnormal findings: Secondary | ICD-10-CM | POA: Diagnosis not present

## 2016-09-30 DIAGNOSIS — Z13220 Encounter for screening for lipoid disorders: Secondary | ICD-10-CM | POA: Diagnosis not present

## 2016-12-01 ENCOUNTER — Encounter: Payer: Self-pay | Admitting: Registered"

## 2016-12-01 ENCOUNTER — Encounter: Payer: 59 | Attending: Family Medicine | Admitting: Registered"

## 2016-12-01 DIAGNOSIS — Z713 Dietary counseling and surveillance: Secondary | ICD-10-CM | POA: Diagnosis not present

## 2016-12-01 DIAGNOSIS — E669 Obesity, unspecified: Secondary | ICD-10-CM | POA: Diagnosis not present

## 2016-12-01 NOTE — Patient Instructions (Addendum)
Make sure to get in three meals per day to provide the nutrients and energy the body needs and promote healthy metabolism. Continue eating a balanced diet-vegetables,fruits, whole grains, lean proteins -see handout.  Make sure to get in plenty of fluid to help prevent kidney stones and ensure your body is getting adequate fluid. Try to set a timer/notification to drink at different times throughout the day. If drinking plain water is difficult could try adding just a little juice to add in some flavor.    Try to include some more physical activity-maybe go on some walks with the kids.

## 2016-12-01 NOTE — Progress Notes (Signed)
Medical Nutrition Therapy:  Appt start time: 1015 end time:  1120.  Assessment:  Primary concerns today: Pt referred for obesity. Pt says she has had reoccurring gallstones and kidney stones and would like to know what she should eat to help prevent having these problems. Pt says she used to be very active, but after having her first kidney stone flare she has not been as active. Pt says she was very sick during her last pregnancy due to kidney stones and gallstones. Pt says she first started having problems with gallstones during a previous pregnancy and she believes it was because she ate a lot of red meat during that time. Pt says she no longer eats any red meat. Pt is currently nursing her 144 month old son and says she wants to make sure she is eating enough as she often feels hungry, but because she is unable to engage in regular physical activity right now, she is also concerned she will gain too much weight. Pt says she has been drinking smoothies and feels she is eating healthy as she tries to eat a lot of fruits and vegetables and doesn't have soda or "junk foods" in her home but she thinks she is not doing enough because she is still having gallstones. Pt says her main concern is being healthy. She says she has been unable to eat solid foods the last two days due to having gallstone pain and has been drinking fruit and vegetable smoothies with added protein powder instead. Pt says she will be seeing a surgeon next month to discuss treatment options for her gallstones. Pt knows she needs to drink more fluids and has been told to drink something every time she nurses, but says gets nauseous if she tries to drink something while nursing. Pt says she would also like to know if she should adopt a vegetarian or vegan diet.   Food Allergies/Intolerances: red meat (abdominal pain) and dairy (GI issues)  Preferred Learning Style:   No preference indicated   Learning Readiness:  Ready  MEDICATIONS:  See list.    DIETARY INTAKE:  Usual eating pattern includes 2 meals and 1-2 snacks per day. Pt says she often does not eat three meals per day. She says she always makes sure her children get in regular meals and snacks, but often does not take the time to ensure the same for herself.   Everyday foods include .  Avoided foods include red meat, dairy, pork.    Pt says for the last 2 days she has only been able to consume smoothies. She says her smoothies typically include mango, kale, apple, spinach, whey protein and sometimes added electrolytes. She says she sometimes fixes them at home or gets one from [facility].    24-hr recall (Typical intake prior to last 2 days in which pt reports having pain from gallstone flare):  B ( AM): 1 egg beater egg, 1-2 slices of [place] bacon or [place] sausage link, and/or 1.5 cup oatmeal, sometimes some fruit with it, water OR almond milk Snk ( AM): None reported   L ( PM): sandwich with oatmeal or whole wheat bread, sliced [place], avocado, mustard, tomato, tortilla chips and salsa  Snk ( PM): None reported.  D ( PM): whole wheat pasta with ground [place] OR chicken, brown rice, vegetables.  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   Progress Towards Goal(s):  In progress.   Nutritional Diagnosis:  NI-5.11.1 Predicted suboptimal nutrient intake As related to skipping meals and inadequate water intake .  As evidenced by pt's reported diet recall/dietary habits .    Intervention:  Nutrition counseling provided. Dietitian provided education regarding balanced nutrition, as well as nutrition to help prevent gallstones. Dietitian discussed the importance of eating consistent meals and how skipping meals slows metabolism and can cause weight gain. Dietitian encouraged pt to focus on  getting in proper nutrition and her overall health rather than focusing on her weight. Discussed with pt that rapid weight loss is not recommended and that it can lead to more gallstones. Dietitian discussed that generally it is best to not lose more than 1-2 lb per week. Dietitian told pt she could incorporate more plant based proteins into her diet if she would like to but would not recommend making major alterations to her diet at this time while she is struggling with getting in her typical diet due to her gallstones. Pt appears to blame herself partially for her gallstone problems. Dietitian discussed with pt that having gallstones is not her fault and she has already made many great changes to her diet to try to promote health such as eating fruits and vegetables, whole grains, and lean proteins. Dietitian discussed how getting in plenty of water is important to help prevent kidney stones and how fluid needs are increased while breast feeding. Pt says she struggles with drinking enough water because she doesn't really like it and would prefer to drink juice but does not want to. Dietitian discussed strategies for increasing water intake (setting reminders/timers to drink; adding a small amount of fruit juice to water for added flavor). Dietitian encouraged pt to get in more physical activity.   Goals:   Make sure to get in three meals per day to provide the nutrients and energy the body needs and promote healthy metabolism. Continue eating a balanced diet-vegetables,fruits, whole grains, lean proteins -see handout.  Make sure to get in plenty of fluid to help prevent kidney stones and ensure your body is getting adequate fluid. Try to set a timer/notification to drink at different times throughout the day. If drinking plain water is difficult could try adding just a little juice to add in some flavor.    Try to include some more physical activity-maybe go on some walks with the kids.   Teaching  Method Utilized: Visual Auditory  Handouts given during visit include:  Balanced plate with food lists  Barriers to learning/adherence to lifestyle change: None indicated.   Demonstrated degree of understanding via:  Teach Back   Monitoring/Evaluation:  Dietary intake, exercise, and body weight in 2 month(s).

## 2016-12-13 DIAGNOSIS — K8020 Calculus of gallbladder without cholecystitis without obstruction: Secondary | ICD-10-CM | POA: Diagnosis not present

## 2017-02-02 ENCOUNTER — Ambulatory Visit: Payer: 59 | Admitting: Registered"

## 2017-05-18 DIAGNOSIS — L989 Disorder of the skin and subcutaneous tissue, unspecified: Secondary | ICD-10-CM | POA: Diagnosis not present

## 2018-06-28 ENCOUNTER — Other Ambulatory Visit: Payer: Self-pay

## 2018-06-28 ENCOUNTER — Encounter (HOSPITAL_BASED_OUTPATIENT_CLINIC_OR_DEPARTMENT_OTHER): Payer: Self-pay

## 2018-06-28 ENCOUNTER — Emergency Department (HOSPITAL_BASED_OUTPATIENT_CLINIC_OR_DEPARTMENT_OTHER)
Admission: EM | Admit: 2018-06-28 | Discharge: 2018-06-28 | Disposition: A | Discharge status: Home / Self Care | Payer: 59 | Attending: Emergency Medicine | Admitting: Emergency Medicine

## 2018-06-28 DIAGNOSIS — S0990XA Unspecified injury of head, initial encounter: Secondary | ICD-10-CM

## 2018-06-28 DIAGNOSIS — Y92008 Other place in unspecified non-institutional (private) residence as the place of occurrence of the external cause: Secondary | ICD-10-CM | POA: Insufficient documentation

## 2018-06-28 DIAGNOSIS — Y9389 Activity, other specified: Secondary | ICD-10-CM | POA: Diagnosis not present

## 2018-06-28 DIAGNOSIS — Z79899 Other long term (current) drug therapy: Secondary | ICD-10-CM | POA: Insufficient documentation

## 2018-06-28 DIAGNOSIS — J45909 Unspecified asthma, uncomplicated: Secondary | ICD-10-CM | POA: Diagnosis not present

## 2018-06-28 DIAGNOSIS — Y998 Other external cause status: Secondary | ICD-10-CM | POA: Insufficient documentation

## 2018-06-28 DIAGNOSIS — W228XXA Striking against or struck by other objects, initial encounter: Secondary | ICD-10-CM | POA: Insufficient documentation

## 2018-06-28 DIAGNOSIS — Z91040 Latex allergy status: Secondary | ICD-10-CM | POA: Diagnosis not present

## 2018-06-28 MED ORDER — ONDANSETRON 4 MG PO TBDP: 4.0000 mg | ORAL_TABLET | Freq: Three times a day (TID) | ORAL | 0 refills | Status: DC | PRN

## 2018-06-28 NOTE — Discharge Instructions (Signed)
Please see the information and instructions below regarding your visit.  Your diagnoses today include:  1. Minor head injury, initial encounter    Concussions are caused by acceleration/deceleration forces of the brain against the skull and in mild forms, cannot be seen on any imaging. The injury occurs at the microscopic level, and causes a disturbance more in function than the structure of the brain itself. Common symptoms of concussion include: ?Poor coordination such as stumbling or inability to walk in a straight line ?Vacant stare (befuddled facial expression) ?Delayed verbal expression (slower to answer questions or follow instructions) ?Inability to focus attention (easily distracted and unable to follow through with normal activities) ?Disorientation (walking in the wrong direction, unaware of time, date, place) ?Slurred or incoherent speech (making disjointed or incomprehensible statements) ?Emotionality out of proportion to circumstances (appearing distraught, crying for no apparent reason) ?Memory deficits (exhibited by patient repeatedly asking the same question that has already been answered or inability to recall three of three words after five minutes)  Tests performed today include:  See side panel of your discharge paperwork for testing performed today. Vital signs are listed at the bottom of these instructions.   Medications prescribed:    Take only ibuprofen (Advil) or naproxen (Aleve), and acetaminophen (Tylenol).  Take any prescribed medications only as prescribed, and any over the counter medications only as directed on the packaging.  Home care instructions:  Please follow any educational materials contained in this packet.    Keep head elevated at all times for the first 24 hours (Elevate mattress if pillow is ineffective)  Do not take tranquilizers, sedatives, narcotics or alcohol  Use ice packs for comfort   Follow-up instructions: Please follow-up with  your primary care provider in one week for further evaluation of your symptoms if they are not completely improved.   Please follow up with Antoine Primas of sports medicine  Return instructions:  Please return to the Emergency Department if you experience worsening symptoms.   If any of the following occur notify your physician or go to the Hospital Emergency Department:  Increased drowsiness, confusion, or loss of consciousness  Restlessness or convulsions (fits)  Paralysis in arms or legs  Temperature above 100 F  Vomiting  Severe headache  Blood or clear fluid dripping from the nose or ears  Stiffness of the neck  Dizziness or blurred vision  Pulsating pain in the eye  Unequal pupils of eye  Personality changes  Any other unusual symptoms  Please return if you have any other emergent concerns.  Additional Information:   Your vital signs today were: BP (!) 141/92 (BP Location: Left Arm)    Pulse 72    Temp 98.1 F (36.7 C) (Oral)    Resp 18    Ht 5\' 5"  (1.651 m)    Wt 84.8 kg    LMP 06/18/2018    SpO2 100%    BMI 31.12 kg/m  If your blood pressure (BP) was elevated on multiple readings during this visit above 130 for the top number or above 80 for the bottom number, please have this repeated by your primary care provider within one month. --------------  Thank you for allowing [place] to participate in your care today. It was my pleasure to care for you!

## 2018-06-28 NOTE — ED Provider Notes (Signed)
[facility] EMERGENCY DEPARTMENT Provider Note   CSN: [id number] Arrival date & time: 06/28/18  2058    History   Chief Complaint Chief Complaint  Patient presents with  . Head Injury    HPI PHEBA TAKADA is a 33 y.o. female.     HPI   Patient is a 33 yo female with a no current medical history presenting for head injury 2 days ago. Patient reports that she was doing laundry 48 hours ago. She reports she was rounding the corner of the laundry room and someone opened the door and hit her head on the door. Patient denies LOC but reports that she felt "dizzy". Patient reports that subsequently she has had headaches, nausea and "fogginess" in the head. Patient denies difficulty with ambulation, visual disturbance, vomiting, weakness or numbness in extremities, or retrograde amnesia. Patient does not take blood thinning medications. 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  Past Medical History:  Diagnosis Date  . Asthma    childhood  . Gallstones   . Kidney calculus   . Renal disorder   . Vaginal Pap smear, abnormal     Patient Active Problem List   Diagnosis Date Noted  . Postpartum care following vaginal delivery 07/13/2016  . Delayed delivery after SROM (spontaneous rupture of membranes) 07/12/2016  . Rupture of amniotic sac less than 24 hours prior to the onset of labor 12/07/2014  . SVD (spontaneous vaginal delivery) 12/07/2014  . Kidney stone complicating pregnancy 10/29/2014    History reviewed. No pertinent surgical history.   OB History    Gravida  4   Para  3   Term  3   Preterm      AB  1   Living  3     SAB  1   TAB      Ectopic      Multiple  0   Live Births  3            Home Medications    Prior to Admission medications   Medication Sig Start Date End Date Taking?  Authorizing Provider  acetaminophen (TYLENOL) 325 MG tablet Take 650 mg by mouth every 6 (six) hours as needed for headache.    [provider]  Norethindrone (CAMILA PO) Take by mouth.    [provider]  Prenatal Vit-Fe Fumarate-FA (PRENATAL MULTIVITAMIN) TABS tablet Take 1 tablet by mouth at bedtime. 07/14/16   Bovard-Stuckert, Augusto Gamble, MD  ranitidine (ZANTAC) 150 MG tablet Take 150 mg by mouth 2 (two) times daily.    [provider]    Family History Family History  Problem Relation Age of Onset  . Epilepsy Mother   . Lung disease Father   . Hypertension Father   . Heart disease Father   . Asthma Brother   . Diabetes Paternal Grandmother     Social History Social History   Tobacco Use  . Smoking status: Never Smoker  . Smokeless tobacco: Never Used  Substance Use Topics  . Alcohol use: Not Currently  . Drug use: No     Allergies   Latex; Milk-related compounds; and Penicillins   Review of Systems Review of Systems  Eyes: Negative for visual disturbance.  Gastrointestinal: Positive for nausea. Negative for abdominal pain and vomiting.  Musculoskeletal: Negative for gait problem and neck pain.  Neurological: Positive for  light-headedness and headaches. Negative for syncope, speech difficulty, weakness and numbness.     Physical Exam Updated Vital Signs BP (!) 141/92 (BP Location: Left Arm)   Pulse 72   Temp 98.1 F (36.7 C) (Oral)   Resp 18   Ht 5\' 5"  (1.651 m)   Wt 84.8 kg   LMP 06/18/2018   SpO2 100%   BMI 31.12 kg/m   Physical Exam Vitals signs and nursing note reviewed.  Constitutional:      General: She is not in acute distress.    Appearance: She is well-developed. She is not diaphoretic.     Comments: Sitting comfortably in bed.  HENT:     Head: Normocephalic and atraumatic.  Eyes:     General:        Right eye: No discharge.        Left eye: No discharge.     Extraocular Movements: Extraocular movements intact.      Conjunctiva/sclera: Conjunctivae normal.     Pupils: Pupils are equal, round, and reactive to light.  Neck:     Musculoskeletal: Normal range of motion.  Cardiovascular:     Rate and Rhythm: Normal rate and regular rhythm.     Comments: Intact, 2+ radial pulse. Abdominal:     General: There is no distension.  Musculoskeletal: Normal range of motion.     Comments: No midline neck TTP.   Skin:    General: Skin is warm and dry.  Neurological:     Mental Status: She is alert.     Comments: Cranial nerves intact to gross observation. Patient moves extremities without difficulty.  Psychiatric:        Behavior: Behavior normal.        Thought Content: Thought content normal.        Judgment: Judgment normal.     Comments: Mental Status:  Alert, oriented, thought content appropriate, able to give a coherent history. Speech fluent without evidence of aphasia. Able to follow 2 step commands without difficulty.  Cranial Nerves:  II:  Peripheral visual fields grossly normal, pupils equal, round, reactive to light III,IV, VI: ptosis not present, extra-ocular motions intact bilaterally  V,VII: smile symmetric, facial light touch sensation equal VIII: hearing grossly normal to voice  X: uvula elevates symmetrically  XI: bilateral shoulder shrug symmetric and strong XII: midline tongue extension without fassiculations Motor:  Normal tone. 5/5 in upper and lower extremities bilaterally including strong and equal grip strength and dorsiflexion/plantar flexion Sensory: Pinprick and light touch normal in all extremities.  Deep Tendon Reflexes: 2+ and symmetric in the biceps and patella. No clonus. Cerebellar: normal finger-to-nose with bilateral upper extremities Gait: normal gait and balance Stance:  No pronator drift and good coordination, strength, and position sense with tapping of bilateral arms (performed in sitting position).       ED Treatments / Results  Labs (all labs ordered are  listed, but only abnormal results are displayed) Labs Reviewed - No data to display  EKG None  Radiology No results found.  Procedures Procedures (including critical care time)  Medications Ordered in ED Medications - No data to display   Initial Impression / Assessment and Plan / ED Course  I have reviewed the triage vital signs and the nursing notes.  Pertinent labs & imaging results that were available during my care of the patient were reviewed by me and considered in my medical decision making (see chart for details).        Patient  is nontoxic appearing and neurologically intact. Patient with minor head injury 2 days ago. [facility] No indication for imaging. Patient reports LMP 10 days ago and current OCP use, and states no chance of pregnancy. Will defer testing per discussion with patient. Suspect minor concussion. Patient instructed to follow up with PCP or concussion clinic. Return precautions given for any vomiting, weakness or numbness in extremities, visual disturbance, or worsening pain. Patient is in understanding and agrees with the plan of care.  Final Clinical Impressions(s) / ED Diagnoses   Final diagnoses:  Minor head injury, initial encounter    ED Discharge Orders         Ordered    ondansetron (ZOFRAN ODT) 4 MG disintegrating tablet  Every 8 hours PRN     06/28/18 2329           Delia ChimesMurray, Maesyn Frisinger B, PA-C 06/29/18 0408    Azalia Bilisampos, Kevin, MD 06/29/18 1723

## 2018-06-28 NOTE — ED Triage Notes (Signed)
Pt states she was pulling a blanket and a door edge struck her left eyebrow area 2 days ago-no break in skin-c/o visual disturbance, HA, "foggy headed"-NAD-steady gait

## 2018-11-16 ENCOUNTER — Ambulatory Visit: Payer: Self-pay | Admitting: General Surgery

## 2018-11-16 NOTE — H&P (Signed)
History of Present Illness Tina Ok MD; 11/16/2018 11:11 AM) The patient is a 33 year old female who presents for evaluation of gall stones. Patient is a 33 year old female who follows back up today secondary to abdominal pain. She's been seen previously secondary to symptomatic cholelithiasis. Patient states that recently 2 nights ago she had continued abdominal pain that woke her up in the middle night. She states this was associated with nausea and vomiting. Patient states that she was unweighted ER when the pain resolved. Patient states that she had [place] food prior to the event of pain.  Patient states that since her last visit now she's had on and off episodes of abdominal pain however this was the worst.  Patient states that she is ready to have her gallbladder removed.  --------------------------------------- Patient is back in today after approximately a 1 year follow-up. She states that she's continued with abdominal pain, epigastric pain. She states that this is usually after eating. She states that she's try to stay away from any fatty foods however on with any food at this point causes some abdominal pain. She has had some nausea as well as emesis.  ----------------------------------------- Referred by: Dr. Collene Mares Chief Complaint: Abdominal pain  Patient is a 33 year old female with a 6 months one-year history of abdominal pain she states in the epigastrium and right upper quadrant. She states that pain usually starts after eating. She states that this is likely secondary to high fatty foods. She states that it is severe that she is unable to carry on with her daily thereafter. She states that she also has some accompanying diarrhea at times. She states that she's been trying to stay away from more medial choices and is returning today more vegan type diet.  Patient did have an ultrasound which I reviewed personally which revealed gallstones.    Allergies  [name], [place]; 11/16/2018 10:58 AM) No Known Allergies  [12/13/2016]: Allergies Reconciled   Medication History [name], CMA; 11/16/2018 10:58 AM) [facility] Prenatal (28-0.8MG  Tablet, Oral) Active. Norethindrone (0.35MG  Tablet, Oral) Active. Medications Reconciled    Review of Systems Tina Ok, MD; 11/16/2018 11:11 AM) General Present- Fatigue and Weight Gain. Not Present- Appetite Loss, Chills, Fever, Night Sweats and Weight Loss. Skin Not Present- Change in Wart/Mole, Dryness, Hives, Jaundice, New Lesions, Non-Healing Wounds, Rash and Ulcer. HEENT Not Present- Earache, Hearing Loss, Hoarseness, Nose Bleed, Oral Ulcers, Ringing in the Ears, Seasonal Allergies, Sinus Pain, Sore Throat, Visual Disturbances, Wears glasses/contact lenses and Yellow Eyes. Respiratory Not Present- Bloody sputum, Chronic Cough, Difficulty Breathing, Snoring and Wheezing. Breast Not Present- Breast Mass, Breast Pain, Nipple Discharge and Skin Changes. Cardiovascular Not Present- Chest Pain. Gastrointestinal Present- Abdominal Pain, Change in Bowel Habits, Excessive gas and Indigestion. Not Present- Bloating, Bloody Stool, Chronic diarrhea, Constipation, Difficulty Swallowing, Gets full quickly at meals, Hemorrhoids, Nausea, Rectal Pain and Vomiting. Female Genitourinary Not Present- Frequency, Nocturia, Painful Urination, Pelvic Pain and Urgency. Musculoskeletal Present- Back Pain. Not Present- Joint Pain, Joint Stiffness, Muscle Pain, Muscle Weakness and Swelling of Extremities. Neurological Not Present- Decreased Memory, Fainting, Headaches, Numbness, Seizures, Tingling, Tremor, Trouble walking and Weakness. Psychiatric Not Present- Anxiety, Bipolar, Change in Sleep Pattern, Depression, Fearful and Frequent crying. Endocrine Not Present- Cold Intolerance, Excessive Hunger, Hair Changes, Heat Intolerance, Hot flashes and New Diabetes. Hematology Not Present- Blood Thinners, Easy Bruising,  Excessive bleeding, Gland problems, HIV and Persistent Infections. All other systems negative  Vitals [name] CMA; 11/16/2018 10:58 AM) 11/16/2018 10:57 AM Weight: 188.6 lb  Height: 64in Body Surface Area: 1.91 m Body Mass Index: 32.37 kg/m  Temp.: 98.34F  Pulse: 102 (Regular)  BP: 130/84(Sitting, Left Arm, Standard)       Physical Exam [name](Felder Lebeda, MD; 11/16/2018 11:11 AM) The physical exam findings are as follows: Note: Constitutional: No acute distress, conversant, appears stated age  Eyes: Anicteric sclerae, moist conjunctiva, no lid lag  Neck: No thyromegaly, trachea midline, no cervical lymphadenopathy  Lungs: Clear to auscultation biilaterally, normal respiratory effot  Cardiovascular: regular rate & rhythm, no murmurs, no peripheal edema, pedal pulses 2+  GI: Soft, no masses or hepatosplenomegaly, non-tender to palpation  MSK: Normal gait, no clubbing cyanosis, edema  Skin: No rashes, palpation reveals normal skin turgor  Psychiatric: Appropriate judgment and insight, oriented to person, place, and time    Assessment & Plan [name](Jonerik Sliker MD; 11/16/2018 11:11 AM)  SYMPTOMATIC CHOLELITHIASIS (K80.20) Impression: 33 year old female with symptomatic cholelithiasis 1. Will proceed to the operating room for a laparoscopic cholecystectomy 2 Risks and benefits were discussed with the patient to generally include, but not limited to: infection, bleeding, possible need for post op ERCP, damage to the bile ducts, bile leak, and possible need for further surgery. Alternatives were offered and described. All questions were answered and the patient voiced understanding of the procedure and wishes to proceed at this point with a laparoscopic cholecystectomy

## 2019-10-01 ENCOUNTER — Ambulatory Visit: Payer: BLUE CROSS/BLUE SHIELD | Admitting: Neurology

## 2019-10-13 ENCOUNTER — Ambulatory Visit: Payer: 59 | Attending: Internal Medicine

## 2019-10-13 DIAGNOSIS — Z23 Encounter for immunization: Secondary | ICD-10-CM

## 2019-10-13 NOTE — Progress Notes (Signed)
   Covid-19 Vaccination Clinic  Name:  Tina Anderson    MRN: [id number] DOB: [birth year]  10/13/2019  [name] was observed post Covid-19 immunization for 30 minutes based on pre-vaccination screening without incident. She was provided with Vaccine Information Sheet and instruction to access the V-Safe system.   [name] was instructed to call 911 with any severe reactions post vaccine: Marland Kitchen Difficulty breathing  . Swelling of face and throat  . A fast heartbeat  . A bad rash all over body  . Dizziness and weakness   Immunizations Administered    Name Date Dose VIS Date Route   Pfizer COVID-19 Vaccine 10/13/2019  8:17 AM 0.3 mL 07/04/2018 Intramuscular   Manufacturer: [employer], [employer]   Lot: [id number]   NDC: [address]-5

## 2019-11-05 ENCOUNTER — Ambulatory Visit: Payer: 59 | Attending: Internal Medicine

## 2019-11-05 DIAGNOSIS — Z23 Encounter for immunization: Secondary | ICD-10-CM

## 2019-11-05 NOTE — Progress Notes (Signed)
   Covid-19 Vaccination Clinic  Name:  CHEREE FOWLES    MRN: [id number] DOB: August 14, 1985  11/05/2019  Ms. Collett was observed post Covid-19 immunization for 15 minutes without incident. She was provided with Vaccine Information Sheet and instruction to access the V-Safe system.   Ms. Corado was instructed to call 911 with any severe reactions post vaccine: Marland Kitchen Difficulty breathing  . Swelling of face and throat  . A fast heartbeat  . A bad rash all over body  . Dizziness and weakness   Immunizations Administered    Name Date Dose VIS Date Route   Pfizer COVID-19 Vaccine 11/05/2019  3:12 PM 0.3 mL 07/04/2018 Intramuscular   Manufacturer: [employer], [employer]   Lot: [id number]   NDC: [address]-7

## 2019-11-20 ENCOUNTER — Encounter: Payer: Self-pay | Admitting: Neurology

## 2019-11-20 ENCOUNTER — Ambulatory Visit: Payer: 59 | Admitting: Neurology

## 2019-11-20 VITALS — BP 134/92 | HR 82 | Ht 64.0 in | Wt 194.3 lb

## 2019-11-20 DIAGNOSIS — K068 Other specified disorders of gingiva and edentulous alveolar ridge: Secondary | ICD-10-CM | POA: Diagnosis not present

## 2019-11-20 DIAGNOSIS — R202 Paresthesia of skin: Secondary | ICD-10-CM | POA: Diagnosis not present

## 2019-11-20 NOTE — Progress Notes (Signed)
Subjective:    Patient ID: Tina Anderson is a 34 y.o. female.  HPI     Huston Foley, MD, PhD [facility] [address], [address] [address] [place], [place] [address]  Dear Dr. Docia Chuck,   I saw your patient, Tina Anderson, upon your kind request in my neurologic clinic today for initial consultation of her oral paresthesias after wisdom tooth removal.  The patient is unaccompanied today. She had missed an appt on 10/01/2019. As you know, Tina Anderson Is a 34 year old right-handed woman with an underlying medical history of kidney stones, gallstones, asthma and mild obesity, who reports an abnormal sensation around her chin area for the past 7+ months.  She had wisdom teeth extracted in December 2020.  She reports a numbness sensation, cold sensitivity in the area of the angle of her mouth on both sides, maybe a little worse on the left and also numbness in her chin, also numbness in her lower gums in the front.  She has had no follow-up appointment with her oral surgeon or dentist.  She reports no headaches, no one-sided weakness, no droopy face, no symptoms in her arms or legs.  Sometimes when she uses an electric breast she has needlelike sensation stinging pain in the gum.  I reviewed your office note from 09/21/2019.  She had tried gabapentin but could not tolerate it.  She had drowsiness from it, unclear what dose she took, but recalls that she simply could not function.  Symptoms have essentially plateaued, no progression.  No visual symptoms such as blurry vision or double vision.  No hearing loss.  Her Past Medical History Is Significant For: Past Medical History:  Diagnosis Date  . Asthma    childhood  . Gallstones   . Kidney calculus   . Renal disorder   . Vaginal Pap smear, abnormal     Her Past Surgical History Is Significant For: History reviewed. No pertinent surgical history.  Her Family History Is Significant For: Family History  Problem Relation  Age of Onset  . Epilepsy Mother   . Lung disease Father   . Hypertension Father   . Heart disease Father   . Asthma Brother   . Diabetes Paternal Grandmother     Her Social History Is Significant For: Social History   Socioeconomic History  . Marital status: Married    Spouse name: Not on file  . Number of children: Not on file  . Years of education: Not on file  . Highest education level: Not on file  Occupational History  . Not on file  Tobacco Use  . Smoking status: Never Smoker  . Smokeless tobacco: Never Used  Vaping Use  . Vaping Use: Never used  Substance and Sexual Activity  . Alcohol use: Not Currently  . Drug use: No  . Sexual activity: Not on file  Other Topics Concern  . Not on file  Social History Narrative  . Not on file   Social Determinants of Health   Financial Resource Strain:   . Difficulty of Paying Living Expenses:   Food Insecurity:   . Worried About Programme researcher, broadcasting/film/video in the Last Year:   . Barista in the Last Year:   Transportation Needs:   . Freight forwarder (Medical):   Marland Kitchen Lack of Transportation (Non-Medical):   Physical Activity:   . Days of Exercise per Week:   . Minutes of Exercise per Session:   Stress:   .  Feeling of Stress :   Social Connections:   . Frequency of Communication with Friends and Family:   . Frequency of Social Gatherings with Friends and Family:   . Attends Religious Services:   . Active Member of Clubs or Organizations:   . Attends Banker Meetings:   Marland Kitchen Marital Status:     Her Allergies Are:  Allergies  Allergen Reactions  . Latex Hives  . Milk-Related Compounds     Dairy   . Penicillins Hives and Rash    Has patient had a PCN reaction causing immediate rash, facial/tongue/throat swelling, SOB or lightheadedness with hypotension: Unknown Has patient had a PCN reaction causing severe rash involving mucus membranes or skin necrosis: Unknown Has patient had a PCN reaction that  required hospitalization Yes Has patient had a PCN reaction occurring within the last 10 years: No If all of the above answers are "NO", then may proceed with Cephalosporin use.   :   Her Current Medications Are:  Outpatient Encounter Medications as of 11/20/2019  Medication Sig  . Multiple Vitamin (MULTIVITAMIN PO) Take by mouth.  . sertraline (ZOLOFT) 50 MG tablet Take 50 mg by mouth daily.  . [DISCONTINUED] acetaminophen (TYLENOL) 325 MG tablet Take 650 mg by mouth every 6 (six) hours as needed for headache.  . [DISCONTINUED] Norethindrone (CAMILA PO) Take by mouth.  . [DISCONTINUED] ondansetron (ZOFRAN ODT) 4 MG disintegrating tablet Take 1 tablet (4 mg total) by mouth every 8 (eight) hours as needed for nausea or vomiting.  . [DISCONTINUED] Prenatal Vit-Fe Fumarate-FA (PRENATAL MULTIVITAMIN) TABS tablet Take 1 tablet by mouth at bedtime.  . [DISCONTINUED] ranitidine (ZANTAC) 150 MG tablet Take 150 mg by mouth 2 (two) times daily.   No facility-administered encounter medications on file as of 11/20/2019.  :   Review of Systems:  Out of a complete 14 point review of systems, all are reviewed and negative with the exception of these symptoms as listed below:   Review of Systems  Neurological:       Pt here to discuss worsening numbness  and temperature sensitivity  around and in her mouth. Pt reports on 05/08/19 she had her wisdom teeth extracted and sx have been present since. Pt has tried gabapentin (rx'd by pcp) but could not tolerate due to drowsiness.  Pt report she has been discouraged with the persisting sx.    Objective:  Neurological Exam  Physical Exam Physical Examination:   Vitals:   11/20/19 1008  BP: (!) 134/92  Pulse: 82  SpO2: 97%    General Examination: The patient is a very pleasant 34 y.o. female in no acute distress. She appears well-developed and well-nourished and well groomed.   HEENT: Normocephalic, atraumatic, pupils are equal, round and  reactive to light and accommodation. Funduscopic exam is normal with sharp disc margins noted. Extraocular tracking is good without limitation to gaze excursion or nystagmus noted. Normal smooth pursuit is noted. Hearing is grossly intact. Face is symmetric with normal facial animation and normal facial sensation, with the exception of more pronounced sensation of pinprick in the bilateral chin areas, also hypersensitivity to temperature check, no complete numbness. Speech is clear with no dysarthria noted. There is no hypophonia. There is no lip, neck/head, jaw or voice tremor. Neck is supple with full range of passive and active motion. There are no carotid bruits on auscultation. Oropharynx exam reveals: No significant mouth dryness, healthy appearing gumline, no obvious swelling or redness or receding gumline.  Tongue  protrudes centrally and palate elevates symmetrically, tongue movements normal.   Chest: Clear to auscultation without wheezing, rhonchi or crackles noted.  Heart: S1+S2+0, regular and normal without murmurs, rubs or gallops noted.   Abdomen: Soft, non-tender and non-distended with normal bowel sounds appreciated on auscultation.  Extremities: There is no pitting edema in the distal lower extremities bilaterally. Pedal pulses are intact.  Skin: Warm and dry without trophic changes noted. There are no varicose veins.  Musculoskeletal: exam reveals no obvious joint deformities, tenderness or joint swelling or erythema.   Neurologically:  Mental status: The patient is awake, alert and oriented in all 4 spheres. Her immediate and remote memory, attention, language skills and fund of knowledge are appropriate. There is no evidence of aphasia, agnosia, apraxia or anomia. Speech is clear with normal prosody and enunciation. Thought process is linear. Mood is normal and affect is normal.  Cranial nerves II - XII are as described above under HEENT exam. In addition: shoulder shrug is normal  with equal shoulder height noted. Motor exam: Normal bulk, strength and tone is noted. There is no drift, tremor or rebound. Romberg is negative. Reflexes are 2+ throughout. Babinski: Toes are flexor bilaterally. Fine motor skills and coordination: intact with normal finger taps, normal hand movements, normal rapid alternating patting, normal foot taps and normal foot agility.  Cerebellar testing: No dysmetria or intention tremor on finger to nose testing. Heel to shin is unremarkable bilaterally. There is no truncal or gait ataxia.  Sensory exam: intact to light touch, pinprick, vibration, temperature sense in the upper and lower extremities.  Gait, station and balance: She stands easily. No veering to one side is noted. No leaning to one side is noted. Posture is age-appropriate and stance is narrow based. Gait shows normal stride length and normal pace. No problems turning are noted. Tandem walk is unremarkable.    Assessment and Plan:   Assessment and Plan:  In summary, CATERA HANKINS is a very pleasant 34 y.o.-year old female with an underlying medical history of kidney stones, gallstones, asthma and mild obesity, who presents for evaluation of her paresthesias affecting the lower face area including the lower gum in the front as well as the chin area.  She reports having had symptoms since she had wisdom teeth extracted in December 2020.  Symptoms are thankfully not progressive but have not improved significantly.  She has a normal neurological exam with the exception of hypersensitivity to pinprick and temperature sensation in the chin area.  She has no obvious lesion in the mouth or receding gumline or swelling of the gums.  She is encouraged to seek an opinion from another dentist if she feels more comfortable with another practice or a oral surgeon for a second opinion.  Unfortunately, there is not a whole lot I can offer her, she had side effects with gabapentin.  I explained to her that  gabapentin and similar medication for nerve pain tend to reduce the painful sensation but typically do not alleviate the numbness sensation.  She has been advised that gabapentin is typically better tolerated than any other symptomatic treatment that we can try for nerve related pain.  I suggested we proceed with a trigeminal/facial MRI with and without contrast to rule out any structural cause of her symptoms.  We will keep her posted as to her test results by phone call.  Her neurological exam otherwise is rather reassuring.  She was advised that I recommend she follow-up with your office and  discussed with you the possibility of seeing another dentist or oral surgeon for second opinion.  I answered all her questions today and she was in agreement with the plan.   Thank you very much for allowing me to participate in the care of this nice patient. If I can be of any further assistance to you please do not hesitate to call me at [phone].  Sincerely,   Huston FoleySaima Aizah Gehlhausen, MD, PhD

## 2019-11-20 NOTE — Patient Instructions (Signed)
It was nice to meet you today.  I am sorry to hear that you still have numbness and tingling in the chin and abnormal sensations and pain in the gum areas.  From what I can see, you have a healthy looking gumline and no obvious physical changes to the face, no complete numbness on examination and otherwise your neurological exam is normal which is very reassuring.  Unfortunately, you could not tolerate gabapentin and I would not favor trying it again as you had side effects and trying another medication for nerve pain may cause similar side effects; typically, gabapentin is better tolerated than other medications.  On the positive side, you are not progressively getting worse and your exam was normal today, other than your abnormal sensation in the chin areas; you may feel improvement over time.  I would also recommend you consider a second opinion with a dentist or oral surgeon.  From my end of things, as discussed, I would like to proceed with a facial MRI, also known as trigeminal MRI to make sure there is no structural cause for inflammation in the trigeminal nerve areas on either side.  We will call you to schedule your MRI once we have insurance authorization and also with the results to keep you posted.  We will do blood work today to make sure your kidney and liver functions are okay prior to proceeding with the MRI.  Unfortunately, there is not a whole lot I can offer you.

## 2019-11-21 LAB — COMPREHENSIVE METABOLIC PANEL
ALT: 26 IU/L (ref 0–32)
AST: 26 IU/L (ref 0–40)
Albumin/Globulin Ratio: 2 (ref 1.2–2.2)
Albumin: 4.5 g/dL (ref 3.8–4.8)
Alkaline Phosphatase: 79 IU/L (ref 48–121)
BUN/Creatinine Ratio: 13 (ref 9–23)
BUN: 7 mg/dL (ref 6–20)
Bilirubin Total: 0.3 mg/dL (ref 0.0–1.2)
CO2: 24 mmol/L (ref 20–29)
Calcium: 9.5 mg/dL (ref 8.7–10.2)
Chloride: 104 mmol/L (ref 96–106)
Creatinine, Ser: 0.53 mg/dL — ABNORMAL LOW (ref 0.57–1.00)
GFR calc Af Amer: 145 mL/min/{1.73_m2} (ref >59)
GFR calc non Af Amer: 126 mL/min/{1.73_m2} (ref >59)
Globulin, Total: 2.3 g/dL (ref 1.5–4.5)
Glucose: 93 mg/dL (ref 65–99)
Potassium: 4.7 mmol/L (ref 3.5–5.2)
Sodium: 140 mmol/L (ref 134–144)
Total Protein: 6.8 g/dL (ref 6.0–8.5)

## 2019-11-22 ENCOUNTER — Telehealth: Payer: Self-pay | Admitting: Neurology

## 2019-11-22 DIAGNOSIS — K068 Other specified disorders of gingiva and edentulous alveolar ridge: Secondary | ICD-10-CM

## 2019-11-22 DIAGNOSIS — R202 Paresthesia of skin: Secondary | ICD-10-CM

## 2019-11-22 NOTE — Telephone Encounter (Signed)
Patient returned my call she is scheduled at [facility] for 12/05/19.

## 2019-11-22 NOTE — Telephone Encounter (Signed)
LVM for pt to call back about scheduling [facility] Auth: [id number] (exp. 11/22/19 to 01/06/20)

## 2019-12-04 NOTE — Telephone Encounter (Signed)
Patient called stating that she may be pregnant but she isn't 100% sure yet. She stated that her OB told her to cancel her appt and if she is pregnant to wait after the 1st trimester.

## 2019-12-05 ENCOUNTER — Other Ambulatory Visit: Payer: 59

## 2019-12-06 NOTE — Telephone Encounter (Signed)
Patient returned my call she is scheduled at [facility] for 12/11/19.

## 2019-12-06 NOTE — Telephone Encounter (Signed)
Patient left me a voicemail on my phone informing me she took the blood test and she is not pregnant and wanted to r/s. I called her phone to r/s and her mail box is full.

## 2019-12-11 ENCOUNTER — Ambulatory Visit: Payer: 59

## 2019-12-11 DIAGNOSIS — R202 Paresthesia of skin: Secondary | ICD-10-CM

## 2019-12-11 DIAGNOSIS — K068 Other specified disorders of gingiva and edentulous alveolar ridge: Secondary | ICD-10-CM

## 2019-12-12 ENCOUNTER — Other Ambulatory Visit: Payer: Self-pay | Admitting: Neurology

## 2019-12-12 MED ORDER — ALPRAZOLAM 0.5 MG PO TABS: ORAL_TABLET | ORAL | 0 refills | Status: AC

## 2019-12-12 NOTE — Telephone Encounter (Signed)
Noted, patient is rescheduled for 12/19/19 at [facility].  She states she will need something call to her pharmacy to help her get through the MRI.

## 2019-12-12 NOTE — Addendum Note (Signed)
Addended by: Ann Maki on: 12/12/2019 10:50 AM   Modules accepted: Orders

## 2019-12-12 NOTE — Telephone Encounter (Signed)
Patient was unable to have the MRI yesterday due to she was claustrophobic. Can you put a new MRI order in for me to be able to r/s with her. Thank you!

## 2019-12-12 NOTE — Telephone Encounter (Signed)
I called the pt and advised rx has been sent.  Advised she can take 1-2 tabs 30 mins prior and then the 3 tab if needed at the time of the test.  Pt was advised to have a driver present to and from the appt.

## 2019-12-12 NOTE — Telephone Encounter (Signed)
I sent in #3 xanax tablets to [facility] on [address]  --- take 2-3 befre MRI

## 2019-12-19 ENCOUNTER — Ambulatory Visit (INDEPENDENT_AMBULATORY_CARE_PROVIDER_SITE_OTHER): Payer: 59

## 2019-12-19 ENCOUNTER — Encounter: Payer: Self-pay | Admitting: Neurology

## 2019-12-19 ENCOUNTER — Other Ambulatory Visit: Payer: Self-pay

## 2019-12-19 DIAGNOSIS — K068 Other specified disorders of gingiva and edentulous alveolar ridge: Secondary | ICD-10-CM | POA: Diagnosis not present

## 2019-12-19 DIAGNOSIS — R202 Paresthesia of skin: Secondary | ICD-10-CM

## 2019-12-19 MED ORDER — GADOBENATE DIMEGLUMINE 529 MG/ML IV SOLN
19.0000 mL | Freq: Once | INTRAVENOUS | Status: AC | PRN
  Administered 2019-12-19: 19 mL via INTRAVENOUS

## 2019-12-24 ENCOUNTER — Telehealth: Payer: Self-pay

## 2019-12-24 NOTE — Progress Notes (Signed)
Please call patient and advise her that her facial/trigeminal MRI with and without contrast was reported as normal.

## 2019-12-24 NOTE — Telephone Encounter (Signed)
-----   Message from Huston Foley, MD sent at 12/24/2019  9:46 AM EDT ----- Please call patient and advise her that her facial/trigeminal MRI with and without contrast was reported as normal.

## 2019-12-24 NOTE — Telephone Encounter (Signed)
I contacted the pt and advised of MRI report via vm ( ok per dpr).  Pt was advised to call back if she had any questions/concerns.

## 2019-12-25 NOTE — Telephone Encounter (Signed)
Pt has called for the results, message from Dr Frances Furbish was relayed.  Pt would like a call back as she still has concerns re: facial pain

## 2019-12-25 NOTE — Telephone Encounter (Signed)
I attempted to reach the pt. Received a message stating call could not be completed at this time and to try again later.

## 2019-12-27 NOTE — Telephone Encounter (Signed)
Attempted to reach the pt for the second time same message as below was received.

## 2019-12-31 NOTE — Telephone Encounter (Signed)
Attempted to reach the pt for the third time and received the same message. Will wait for the pt to call back so we can address her questions.

## 2021-01-05 ENCOUNTER — Emergency Department (HOSPITAL_BASED_OUTPATIENT_CLINIC_OR_DEPARTMENT_OTHER): Payer: 59

## 2021-01-05 ENCOUNTER — Emergency Department (HOSPITAL_BASED_OUTPATIENT_CLINIC_OR_DEPARTMENT_OTHER)
Admission: EM | Admit: 2021-01-05 | Discharge: 2021-01-05 | Disposition: A | Discharge status: Home / Self Care | Payer: 59 | Attending: Emergency Medicine | Admitting: Emergency Medicine

## 2021-01-05 ENCOUNTER — Other Ambulatory Visit: Payer: Self-pay

## 2021-01-05 ENCOUNTER — Encounter (HOSPITAL_BASED_OUTPATIENT_CLINIC_OR_DEPARTMENT_OTHER): Payer: Self-pay | Admitting: Obstetrics and Gynecology

## 2021-01-05 DIAGNOSIS — J45909 Unspecified asthma, uncomplicated: Secondary | ICD-10-CM | POA: Insufficient documentation

## 2021-01-05 DIAGNOSIS — R109 Unspecified abdominal pain: Secondary | ICD-10-CM | POA: Diagnosis present

## 2021-01-05 DIAGNOSIS — Z91040 Latex allergy status: Secondary | ICD-10-CM | POA: Insufficient documentation

## 2021-01-05 DIAGNOSIS — N200 Calculus of kidney: Secondary | ICD-10-CM | POA: Insufficient documentation

## 2021-01-05 LAB — CBC
HCT: 39.9 % (ref 36.0–46.0)
Hemoglobin: 13.3 g/dL (ref 12.0–15.0)
MCH: 31.4 pg (ref 26.0–34.0)
MCHC: 33.3 g/dL (ref 30.0–36.0)
MCV: 94.1 fL (ref 80.0–100.0)
Platelets: 275 10*3/uL (ref 150–400)
RBC: 4.24 MIL/uL (ref 3.87–5.11)
RDW: 13.8 % (ref 11.5–15.5)
WBC: 10.7 10*3/uL — ABNORMAL HIGH (ref 4.0–10.5)
nRBC: 0 % (ref 0.0–0.2)

## 2021-01-05 LAB — COMPREHENSIVE METABOLIC PANEL
ALT: 14 U/L (ref 0–44)
AST: 17 U/L (ref 15–41)
Albumin: 4.3 g/dL (ref 3.5–5.0)
Alkaline Phosphatase: 44 U/L (ref 38–126)
Anion gap: 10 (ref 5–15)
BUN: 9 mg/dL (ref 6–20)
CO2: 21 mmol/L — ABNORMAL LOW (ref 22–32)
Calcium: 9.2 mg/dL (ref 8.9–10.3)
Chloride: 106 mmol/L (ref 98–111)
Creatinine, Ser: 0.64 mg/dL (ref 0.44–1.00)
GFR, Estimated: >60 mL/min (ref >60)
Glucose, Bld: 126 mg/dL — ABNORMAL HIGH (ref 70–99)
Potassium: 3.5 mmol/L (ref 3.5–5.1)
Sodium: 137 mmol/L (ref 135–145)
Total Bilirubin: 0.4 mg/dL (ref 0.3–1.2)
Total Protein: 7 g/dL (ref 6.5–8.1)

## 2021-01-05 LAB — URINALYSIS, ROUTINE W REFLEX MICROSCOPIC
Bilirubin Urine: NEGATIVE
Glucose, UA: NEGATIVE mg/dL
Ketones, ur: 15 mg/dL — AB
Leukocytes,Ua: NEGATIVE
Nitrite: NEGATIVE
Specific Gravity, Urine: 1.022 (ref 1.005–1.030)
pH: 5.5 (ref 5.0–8.0)

## 2021-01-05 LAB — LIPASE, BLOOD: Lipase: 18 U/L (ref 11–51)

## 2021-01-05 LAB — PREGNANCY, URINE: Preg Test, Ur: NEGATIVE

## 2021-01-05 MED ORDER — OXYCODONE-ACETAMINOPHEN 5-325 MG PO TABS
1.0000 | ORAL_TABLET | ORAL | Status: AC | PRN
  Administered 2021-01-05 (×2): 1 via ORAL
  Filled 2021-01-05 (×2): qty 1

## 2021-01-05 MED ORDER — HYDROMORPHONE HCL 1 MG/ML IJ SOLN
0.5000 mg | Freq: Once | INTRAMUSCULAR | Status: AC
  Administered 2021-01-05: 0.5 mg via INTRAVENOUS
  Filled 2021-01-05: qty 1

## 2021-01-05 MED ORDER — ONDANSETRON 4 MG PO TBDP: 4.0000 mg | ORAL_TABLET | Freq: Three times a day (TID) | ORAL | 0 refills | Status: AC | PRN

## 2021-01-05 MED ORDER — OXYCODONE-ACETAMINOPHEN 5-325 MG PO TABS: 1.0000 | ORAL_TABLET | Freq: Three times a day (TID) | ORAL | 0 refills | Status: AC | PRN

## 2021-01-05 MED ORDER — ONDANSETRON HCL 4 MG/2ML IJ SOLN
4.0000 mg | Freq: Once | INTRAMUSCULAR | Status: AC
  Administered 2021-01-05: 4 mg via INTRAVENOUS
  Filled 2021-01-05: qty 2

## 2021-01-05 MED ORDER — KETOROLAC TROMETHAMINE 15 MG/ML IJ SOLN
15.0000 mg | Freq: Once | INTRAMUSCULAR | Status: AC
  Administered 2021-01-05: 15 mg via INTRAVENOUS
  Filled 2021-01-05: qty 1

## 2021-01-05 NOTE — ED Provider Notes (Signed)
[facility] EMERGENCY DEPT Provider Note   CSN: [id number] Arrival date & time: 01/05/21  1629     History Chief Complaint  Patient presents with   Flank Pain    Tina Anderson is a 35 y.o. female.  The history is provided by the patient.  Flank Pain This is a recurrent problem. Pertinent negatives include no shortness of breath.  Patient with right flank pain.  History of kidney stones.  States this feels the same.  Pain started last night.  Severe.  Some nausea and vomiting.  Some dysuria.  Crampy pain.  Right flank.  Has had kidney stones before but states she has not been able to pass them on her own.  No diarrhea or constipation.    Past Medical History:  Diagnosis Date   Asthma    childhood   Gallstones    Kidney calculus    Renal disorder    Vaginal Pap smear, abnormal     Patient Active Problem List   Diagnosis Date Noted   Postpartum care following vaginal delivery 07/13/2016   Delayed delivery after SROM (spontaneous rupture of membranes) 07/12/2016   Rupture of amniotic sac less than 24 hours prior to the onset of labor 12/07/2014   SVD (spontaneous vaginal delivery) 12/07/2014   Kidney stone complicating pregnancy 10/29/2014    History reviewed. No pertinent surgical history.   OB History     Gravida  4   Para  3   Term  3   Preterm      AB  1   Living  3      SAB  1   IAB      Ectopic      Multiple  0   Live Births  3           Family History  Problem Relation Age of Onset   Epilepsy Mother    Lung disease Father    Hypertension Father    Heart disease Father    Asthma Brother    Diabetes Paternal Grandmother     Social History   Tobacco Use   Smoking status: Never   Smokeless tobacco: Never  Vaping Use   Vaping Use: Never used  Substance Use Topics   Alcohol use: Yes    Alcohol/week: 1.0 standard drink    Types: 1 Glasses of wine per week   Drug use: No    Home Medications Prior to  Admission medications   Medication Sig Start Date End Date Taking? Authorizing Provider  ondansetron (ZOFRAN-ODT) 4 MG disintegrating tablet Take 1 tablet (4 mg total) by mouth every 8 (eight) hours as needed for nausea or vomiting. 01/05/21  Yes Benjiman Core, MD  oxyCODONE-acetaminophen (PERCOCET/ROXICET) 5-325 MG tablet Take 1-2 tablets by mouth every 8 (eight) hours as needed for severe pain. 01/05/21  Yes Benjiman Core, MD  ALPRAZolam Prudy Feeler) 0.5 MG tablet Take 2 or three before MRI 12/12/19   Sater, Pearletha Furl, MD  Multiple Vitamin (MULTIVITAMIN PO) Take by mouth.    [provider]  sertraline (ZOLOFT) 50 MG tablet Take 50 mg by mouth daily. 10/26/19   [provider]    Allergies    Latex, Milk-related compounds, and Penicillins  Review of Systems   Review of Systems  Constitutional:  Negative for appetite change.  HENT:  Negative for congestion.   Respiratory:  Negative for shortness of breath.   Gastrointestinal:  Positive for nausea and vomiting.  Genitourinary:  Positive for  dysuria and flank pain.  Musculoskeletal:  Negative for back pain.  Skin:  Negative for rash.  Neurological:  Negative for weakness.  Psychiatric/Behavioral:  Negative for confusion.    Physical Exam Updated Vital Signs BP 131/77   Pulse 68   Temp 99 F (37.2 C)   Resp 17   LMP 01/04/2021 (Exact Date)   SpO2 99%   Physical Exam Vitals and nursing note reviewed.  Constitutional:      Appearance: Normal appearance.     Comments: Patient appears uncomfortable.  Moving around in the bed.  HENT:     Head: Normocephalic.     Mouth/Throat:     Mouth: Mucous membranes are moist.  Eyes:     Pupils: Pupils are equal, round, and reactive to light.  Cardiovascular:     Rate and Rhythm: Regular rhythm.  Pulmonary:     Breath sounds: No wheezing or rhonchi.  Abdominal:     Tenderness: There is no abdominal tenderness.  Genitourinary:    Comments: CVA tenderness on  right. Musculoskeletal:     Cervical back: Neck supple.     Right lower leg: No edema.     Left lower leg: No edema.  Skin:    General: Skin is warm.     Capillary Refill: Capillary refill takes less than 2 seconds.  Neurological:     Mental Status: She is alert and oriented to person, place, and time.  Psychiatric:        Mood and Affect: Mood normal.    ED Results / Procedures / Treatments   Labs (all labs ordered are listed, but only abnormal results are displayed) Labs Reviewed  CBC - Abnormal; Notable for the following components:      Result Value   WBC 10.7 (*)    All other components within normal limits  URINALYSIS, ROUTINE W REFLEX MICROSCOPIC - Abnormal; Notable for the following components:   Hgb urine dipstick SMALL (*)    Ketones, ur 15 (*)    Protein, ur TRACE (*)    Bacteria, UA RARE (*)    All other components within normal limits  COMPREHENSIVE METABOLIC PANEL - Abnormal; Notable for the following components:   CO2 21 (*)    Glucose, Bld 126 (*)    All other components within normal limits  PREGNANCY, URINE  LIPASE, BLOOD    EKG None  Radiology CT Renal Stone Study  Result Date: 01/05/2021 CLINICAL DATA:  Flank pain, kidney stone EXAM: CT ABDOMEN AND PELVIS WITHOUT CONTRAST TECHNIQUE: Multidetector CT imaging of the abdomen and pelvis was performed following the standard protocol without IV contrast. COMPARISON:  None. FINDINGS: Lower chest: Lung bases are clear. Hepatobiliary: No focal hepatic lesion on noncontrast exam. Multiple gallstones noted. No acute inflammation of the gallbladder. Pancreas: Pancreas is normal. No ductal dilatation. No pancreatic inflammation. Spleen: Normal spleen Adrenals/urinary tract: Mild renal edema, pelvicaliectasis and hydroureter on the RIGHT. Hydroureter extends to the pelvis. There are multiple small calcifications within the pelvis. Difficult to place calcification within the ureter but suspect 1 of these 2-3 mm  calcifications to represent distal RIGHT ureteral calculus. No bladder calculi identified. LEFT kidney is normal. Stomach/Bowel: Stomach, small bowel, appendix, and cecum are normal. The colon and rectosigmoid colon are normal. Vascular/Lymphatic: Abdominal aorta is normal caliber. No periportal or retroperitoneal adenopathy. No pelvic adenopathy. Reproductive: Uterus and adnexa unremarkable. Other: No free fluid. Musculoskeletal: No aggressive osseous lesion. IMPRESSION: 1. RIGHT renal edema and RIGHT hydronephrosis consistent with obstructive  uropathy on the RIGHT. There are several small calcifications between 2 and 3 cm from the RIGHT vascular junction. Suspect one of the these 2- 3 mm calcifications to represent a distal obstructing RIGHT ureteral calculus. The ureters difficult to follow through the pelvis. 2. No bladder calculi. Electronically Signed   By: Genevive Bi M.D.   On: 01/05/2021 20:56    Procedures Procedures   Medications Ordered in ED Medications  oxyCODONE-acetaminophen (PERCOCET/ROXICET) 5-325 MG per tablet 1 tablet (1 tablet Oral Given 01/05/21 2014)  ondansetron (ZOFRAN) injection 4 mg (4 mg Intravenous Given 01/05/21 2051)  HYDROmorphone (DILAUDID) injection 0.5 mg (0.5 mg Intravenous Given 01/05/21 2051)  ketorolac (TORADOL) 15 MG/ML injection 15 mg (15 mg Intravenous Given 01/05/21 2051)    ED Course  I have reviewed the triage vital signs and the nursing notes.  Pertinent labs & imaging results that were available during my care of the patient were reviewed by me and considered in my medical decision making (see chart for details).    MDM Rules/Calculators/A&P                           Patient with right flank pain.  Distal ureter stones found on CT scan.  Urine shows hematuria but no infection.  Feels better after treatment.  Will discharge home with urology follow-up. Final Clinical Impression(s) / ED Diagnoses Final diagnoses:  Kidney stone on right side     Rx / DC Orders ED Discharge Orders          Ordered    ondansetron (ZOFRAN-ODT) 4 MG disintegrating tablet  Every 8 hours PRN        01/05/21 2213    oxyCODONE-acetaminophen (PERCOCET/ROXICET) 5-325 MG tablet  Every 8 hours PRN        01/05/21 2213             Benjiman Core, MD 01/05/21 2213

## 2021-01-05 NOTE — ED Triage Notes (Signed)
Patient reports right sided flank pain and pain with urination. Patient reports she feels sick and nauseated and weak. Patient reports she is concerned about a kidney stone as she has had hx of similar. Patient reports she has never successfully passed a kidney stone at home

## 2021-04-24 ENCOUNTER — Other Ambulatory Visit: Payer: Self-pay | Admitting: General Surgery

## 2021-11-09 IMAGING — CT CT RENAL STONE PROTOCOL
2 of 4 series · 17 of 46 positions shown, 19 images · non-contrast
Comparison: None.

CLINICAL DATA: Flank pain, kidney stone

EXAM:
CT ABDOMEN AND PELVIS WITHOUT CONTRAST
TECHNIQUE: Multidetector CT imaging of the abdomen and pelvis was performed
following the standard protocol without IV contrast.

[Series 2: stone full · axial · 0.79mm/px · z∈[+975,+1390]mm · 14 of 91 slices shown, 16 images]
[im 4/91  soft-tissue]
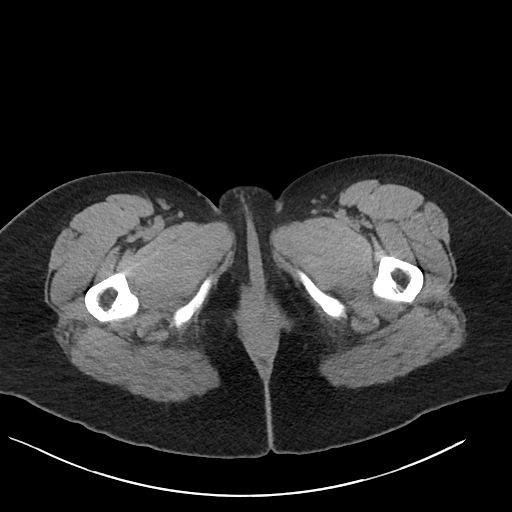
[im 4/91  bone]
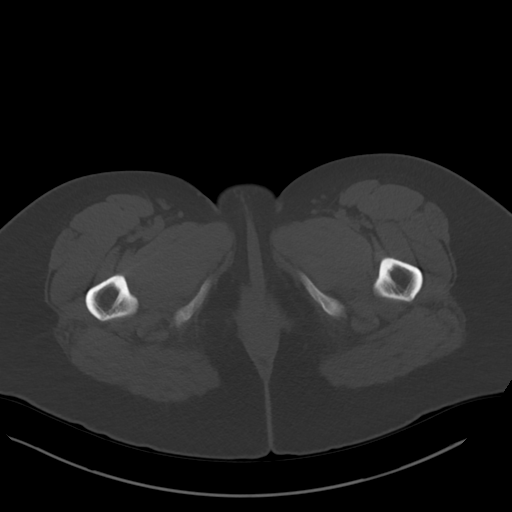
[im 12/91  soft-tissue]
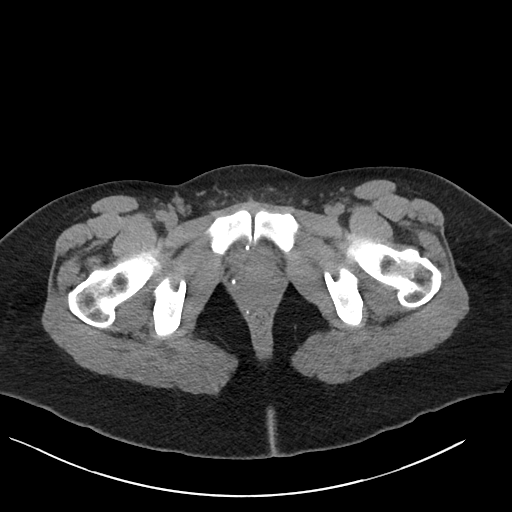
[im 16/91  soft-tissue]
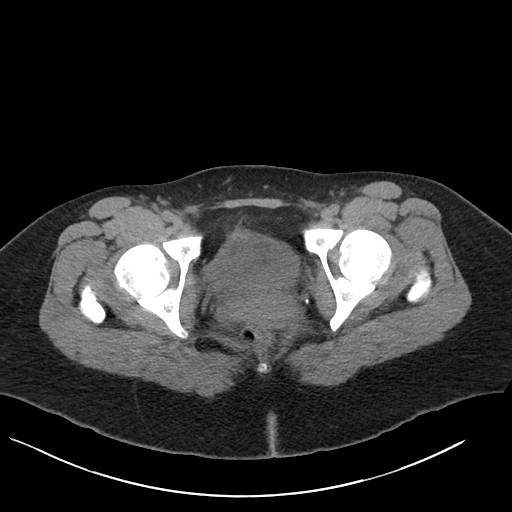
[im 24/91  soft-tissue]
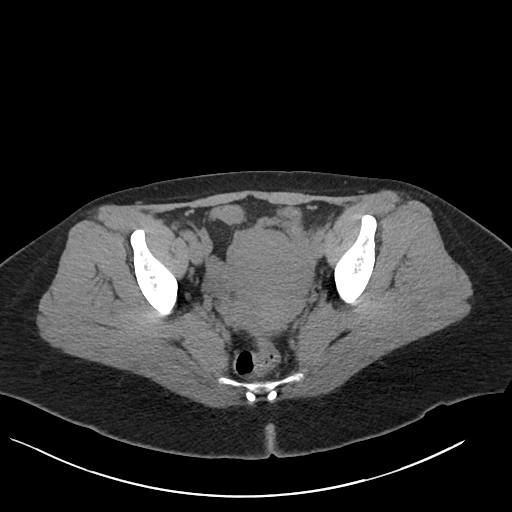
[im 32/91  soft-tissue]
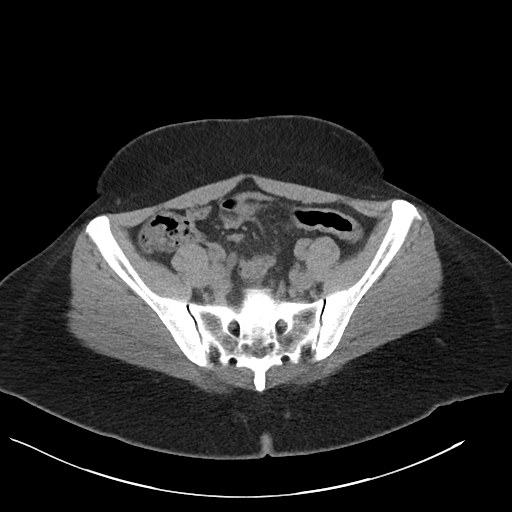
[im 36/91  soft-tissue]
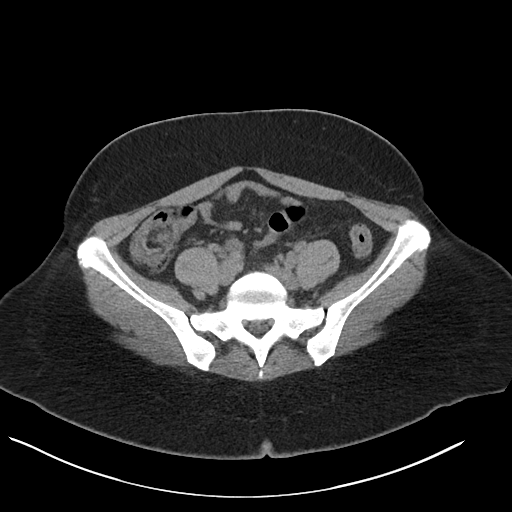
[im 44/91  soft-tissue]
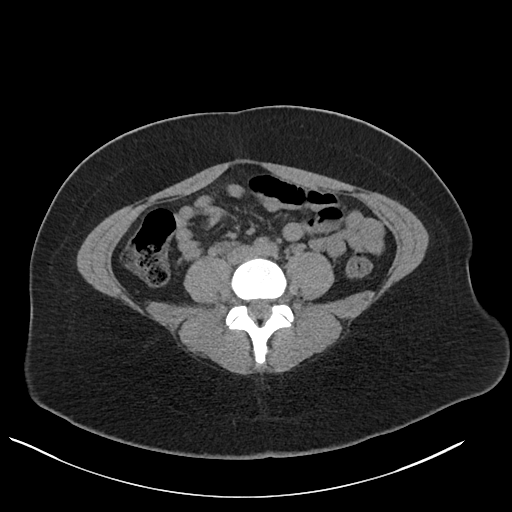
[im 47/91  soft-tissue]
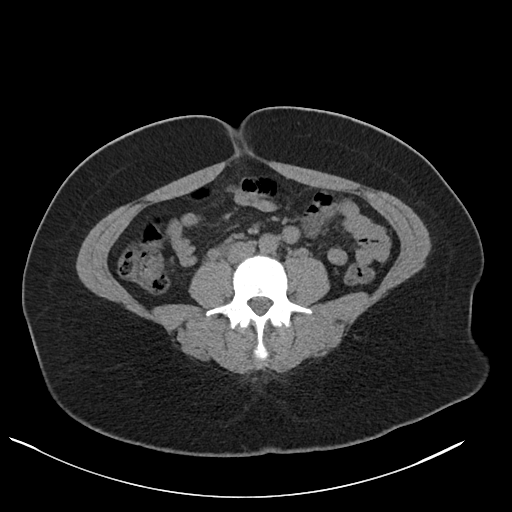
[im 55/91  soft-tissue]
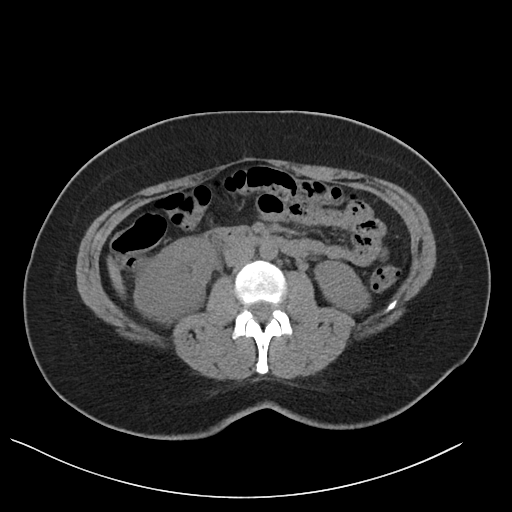
[im 55/91  bone]
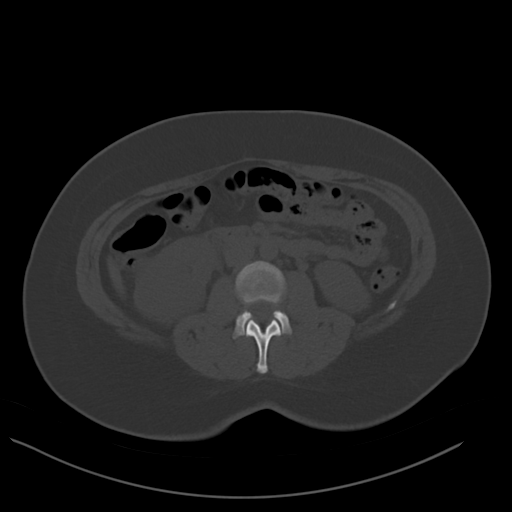
[im 59/91  soft-tissue]
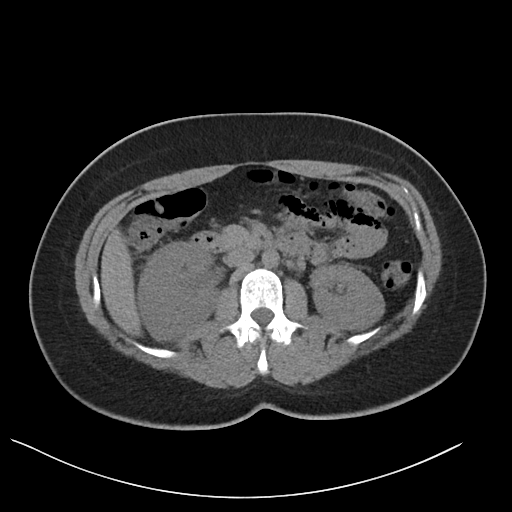
[im 67/91  soft-tissue]
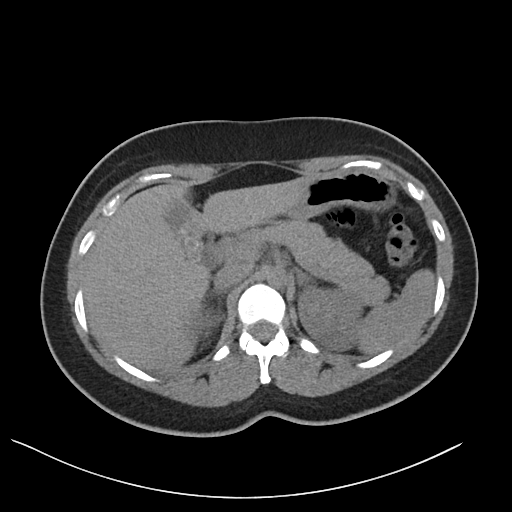
[im 75/91  soft-tissue]
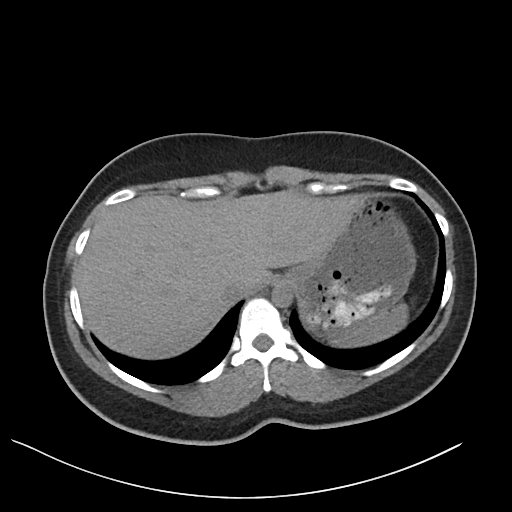
[im 79/91  soft-tissue]
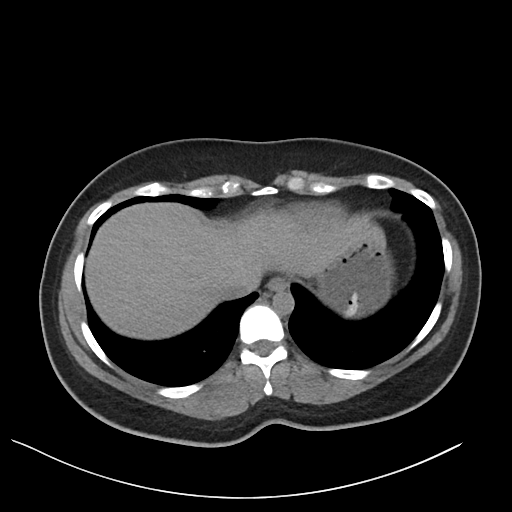
[im 87/91  soft-tissue]
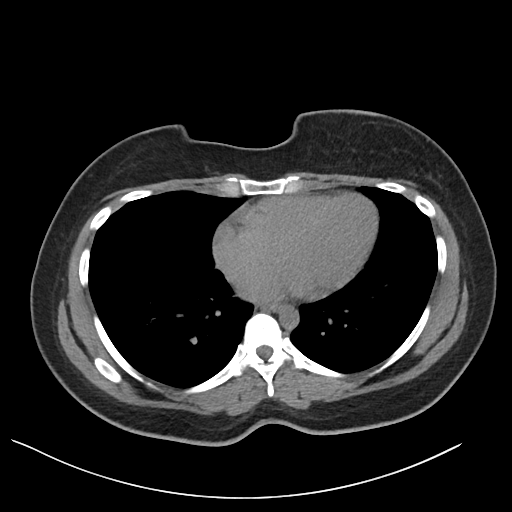

[Series 5: coronal · coronal · 0.89mm/px · 3 of 94 slices shown]
[im 32/94  soft-tissue]
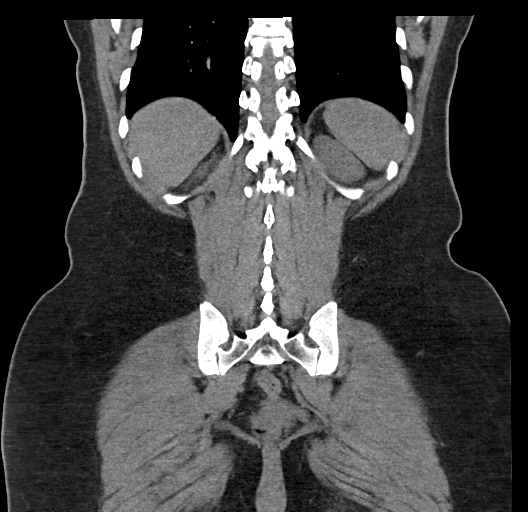
[im 42/94  soft-tissue]
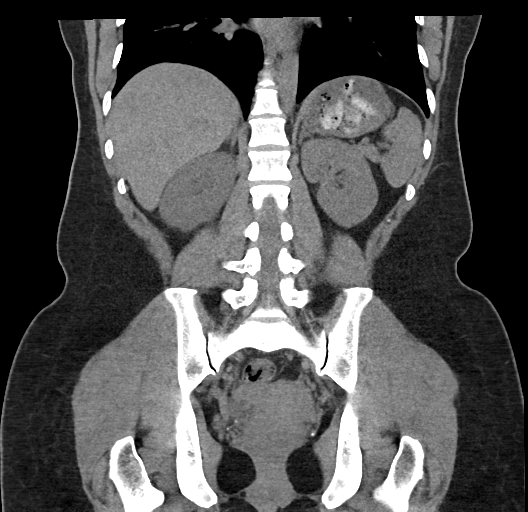
[im 52/94  soft-tissue]
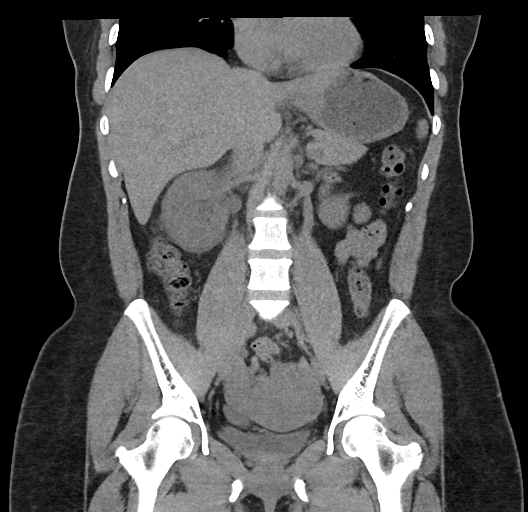

[17 of 46 positions shown; findings below may reference images not displayed]

FINDINGS: Lower chest: Lung bases are clear.

Hepatobiliary: No focal hepatic lesion on noncontrast exam. Multiple
gallstones noted. No acute inflammation of the gallbladder.

Pancreas: Pancreas is normal. No ductal dilatation. No pancreatic
inflammation.

Spleen: Normal spleen

Adrenals/urinary tract: Mild renal edema, pelvicaliectasis and
hydroureter on the RIGHT. Hydroureter extends to the pelvis. There
are multiple small calcifications within the pelvis. Difficult to
place calcification within the ureter but suspect 1 of these 2-3 mm
calcifications to represent distal RIGHT ureteral calculus. No
bladder calculi identified.

LEFT kidney is normal.

Stomach/Bowel: Stomach, small bowel, appendix, and cecum are normal.
The colon and rectosigmoid colon are normal.

Vascular/Lymphatic: Abdominal aorta is normal caliber. No periportal
or retroperitoneal adenopathy. No pelvic adenopathy.

Reproductive: Uterus and adnexa unremarkable.

Other: No free fluid.

Musculoskeletal: No aggressive osseous lesion.
IMPRESSION: 1. RIGHT renal edema and RIGHT hydronephrosis consistent with
obstructive uropathy on the RIGHT. There are several small
calcifications between 2 and 3 cm from the RIGHT vascular junction.
Suspect one of the these 2- 3 mm calcifications to represent a
distal obstructing RIGHT ureteral calculus. The ureters difficult to
follow through the pelvis.
2. No bladder calculi.

## 2022-12-24 ENCOUNTER — Emergency Department (HOSPITAL_BASED_OUTPATIENT_CLINIC_OR_DEPARTMENT_OTHER)
Admission: EM | Admit: 2022-12-24 | Discharge: 2022-12-24 | Disposition: A | Discharge status: Home / Self Care | Payer: 59 | Source: Home / Self Care | Attending: Emergency Medicine | Admitting: Emergency Medicine

## 2022-12-24 ENCOUNTER — Emergency Department (HOSPITAL_BASED_OUTPATIENT_CLINIC_OR_DEPARTMENT_OTHER): Payer: 59

## 2022-12-24 ENCOUNTER — Other Ambulatory Visit: Payer: Self-pay

## 2022-12-24 DIAGNOSIS — R109 Unspecified abdominal pain: Secondary | ICD-10-CM | POA: Insufficient documentation

## 2022-12-24 DIAGNOSIS — J45909 Unspecified asthma, uncomplicated: Secondary | ICD-10-CM | POA: Insufficient documentation

## 2022-12-24 LAB — LIPASE, BLOOD: Lipase: 29 U/L (ref 11–51)

## 2022-12-24 LAB — CBC
HCT: 40.2 % (ref 36.0–46.0)
Hemoglobin: 13 g/dL (ref 12.0–15.0)
MCH: 31.4 pg (ref 26.0–34.0)
MCHC: 32.3 g/dL (ref 30.0–36.0)
MCV: 97.1 fL (ref 80.0–100.0)
Platelets: 307 10*3/uL (ref 150–400)
RBC: 4.14 MIL/uL (ref 3.87–5.11)
RDW: 13.4 % (ref 11.5–15.5)
WBC: 7.2 10*3/uL (ref 4.0–10.5)
nRBC: 0 % (ref 0.0–0.2)

## 2022-12-24 LAB — URINALYSIS, ROUTINE W REFLEX MICROSCOPIC
Bilirubin Urine: NEGATIVE
Glucose, UA: NEGATIVE mg/dL
Ketones, ur: NEGATIVE mg/dL
Leukocytes,Ua: NEGATIVE
Nitrite: NEGATIVE
Protein, ur: NEGATIVE mg/dL
Specific Gravity, Urine: >=1.03 (ref 1.005–1.030)
pH: 6 (ref 5.0–8.0)

## 2022-12-24 LAB — COMPREHENSIVE METABOLIC PANEL
ALT: 28 U/L (ref 0–44)
AST: 25 U/L (ref 15–41)
Albumin: 3.8 g/dL (ref 3.5–5.0)
Alkaline Phosphatase: 65 U/L (ref 38–126)
Anion gap: 9 (ref 5–15)
BUN: 9 mg/dL (ref 6–20)
CO2: 24 mmol/L (ref 22–32)
Calcium: 8.7 mg/dL — ABNORMAL LOW (ref 8.9–10.3)
Chloride: 105 mmol/L (ref 98–111)
Creatinine, Ser: 0.59 mg/dL (ref 0.44–1.00)
GFR, Estimated: >60 mL/min (ref >60)
Glucose, Bld: 109 mg/dL — ABNORMAL HIGH (ref 70–99)
Potassium: 3.7 mmol/L (ref 3.5–5.1)
Sodium: 138 mmol/L (ref 135–145)
Total Bilirubin: 0.3 mg/dL (ref 0.3–1.2)
Total Protein: 6.8 g/dL (ref 6.5–8.1)

## 2022-12-24 LAB — URINALYSIS, MICROSCOPIC (REFLEX)

## 2022-12-24 LAB — PREGNANCY, URINE: Preg Test, Ur: NEGATIVE

## 2022-12-24 MED ORDER — ONDANSETRON HCL 4 MG/2ML IJ SOLN
4.0000 mg | Freq: Once | INTRAMUSCULAR | Status: AC
  Administered 2022-12-24: 4 mg via INTRAVENOUS
  Filled 2022-12-24: qty 2

## 2022-12-24 MED ORDER — HYDROMORPHONE HCL 1 MG/ML IJ SOLN
1.0000 mg | Freq: Once | INTRAMUSCULAR | Status: AC
  Administered 2022-12-24: 1 mg via INTRAVENOUS
  Filled 2022-12-24: qty 1

## 2022-12-24 MED ORDER — LORAZEPAM 2 MG/ML IJ SOLN
1.0000 mg | Freq: Once | INTRAMUSCULAR | Status: AC | PRN
  Administered 2022-12-24: 1 mg via INTRAVENOUS
  Filled 2022-12-24: qty 1

## 2022-12-24 MED ORDER — KETOROLAC TROMETHAMINE 15 MG/ML IJ SOLN
15.0000 mg | Freq: Once | INTRAMUSCULAR | Status: AC
  Administered 2022-12-24: 15 mg via INTRAVENOUS
  Filled 2022-12-24: qty 1

## 2022-12-24 MED ORDER — NAPROXEN 500 MG PO TABS: 500.0000 mg | ORAL_TABLET | Freq: Two times a day (BID) | ORAL | 0 refills | Status: AC

## 2022-12-24 MED ORDER — SODIUM CHLORIDE 0.9 % IV BOLUS
1000.0000 mL | Freq: Once | INTRAVENOUS | Status: AC
  Administered 2022-12-24: 1000 mL via INTRAVENOUS

## 2022-12-24 NOTE — Discharge Instructions (Signed)
You were evaluated in the Emergency Department and after careful evaluation, we did not find any emergent condition requiring admission or further testing in the hospital.  Your exam/testing today is overall reassuring.  Symptoms likely due to a muscular strain or spasm.  Take the Naprosyn anti-inflammatory as directed.  Follow-up with your regular doctors.  Please return to the Emergency Department if you experience any worsening of your condition.   Thank you for allowing [place] to be a part of your care.

## 2022-12-24 NOTE — ED Provider Notes (Signed)
[facility] Emergency Department Provider Note MRN:  [id number]  Arrival date & time: 12/24/22     Chief Complaint   Flank Pain   History of Present Illness   Tina Anderson is a 37 y.o. year-old female with a history of kidney stone presenting to the ED with chief complaint of flank pain.  Left flank pain with some radiation to the left lower quadrant, present for the past several hours associated with nausea.  Feels very similar to prior kidney stones.  No fever.  Review of Systems  A thorough review of systems was obtained and all systems are negative except as noted in the HPI and PMH.   Patient's Health History    Past Medical History:  Diagnosis Date   Asthma    childhood   Gallstones    Kidney calculus    Renal disorder    Vaginal Pap smear, abnormal     No past surgical history on file.  Family History  Problem Relation Age of Onset   Epilepsy Mother    Lung disease Father    Hypertension Father    Heart disease Father    Asthma Brother    Diabetes Paternal Grandmother     Social History   Socioeconomic History   Marital status: Married    Spouse name: Not on file   Number of children: Not on file   Years of education: Not on file   Highest education level: Not on file  Occupational History   Not on file  Tobacco Use   Smoking status: Never   Smokeless tobacco: Never  Vaping Use   Vaping status: Never Used  Substance and Sexual Activity   Alcohol use: Yes    Alcohol/week: 1.0 standard drink of alcohol    Types: 1 Glasses of wine per week   Drug use: No   Sexual activity: Yes    Birth control/protection: Pill    Comment: Husband had Vasectomy  Other Topics Concern   Not on file  Social History Narrative   Not on file   Social Determinants of Health   Financial Resource Strain: Not on file  Food Insecurity: Not on file  Transportation Needs: Not on file  Physical Activity: Not on file  Stress: Not on file   Social Connections: Not on file  Intimate Partner Violence: Not on file     Physical Exam   Vitals:   12/24/22 0546 12/24/22 0549  BP: (!) 140/85   Pulse: 87   Resp: 15   Temp:  98 F (36.7 C)  SpO2: 100%     CONSTITUTIONAL: Well-appearing, appears uncomfortable NEURO/PSYCH:  Alert and oriented x 3, no focal deficits EYES:  eyes equal and reactive ENT/NECK:  no LAD, no JVD CARDIO: Regular rate, well-perfused, normal S1 and S2 PULM:  CTAB no wheezing or rhonchi GI/GU:  non-distended, non-tender MSK/SPINE:  No gross deformities, no edema SKIN:  no rash, atraumatic   *Additional and/or pertinent findings included in MDM below  Diagnostic and Interventional Summary    EKG Interpretation Date/Time:    Ventricular Rate:    PR Interval:    QRS Duration:    QT Interval:    QTC Calculation:   R Axis:      Text Interpretation:         Labs Reviewed  COMPREHENSIVE METABOLIC PANEL - Abnormal; Notable for the following components:      Result Value   Glucose, Bld 109 (*)  Calcium 8.7 (*)    All other components within normal limits  URINALYSIS, ROUTINE W REFLEX MICROSCOPIC - Abnormal; Notable for the following components:   APPearance HAZY (*)    Hgb urine dipstick TRACE (*)    All other components within normal limits  URINALYSIS, MICROSCOPIC (REFLEX) - Abnormal; Notable for the following components:   Bacteria, UA FEW (*)    All other components within normal limits  CBC  LIPASE, BLOOD  PREGNANCY, URINE    CT Renal Stone Study  Final Result      Medications  HYDROmorphone (DILAUDID) injection 1 mg (1 mg Intravenous Given 12/24/22 0607)  ondansetron (ZOFRAN) injection 4 mg (4 mg Intravenous Given 12/24/22 2130)  sodium chloride 0.9 % bolus 1,000 mL (0 mLs Intravenous Stopped 12/24/22 0703)  ketorolac (TORADOL) 15 MG/ML injection 15 mg (15 mg Intravenous Given 12/24/22 0607)  LORazepam (ATIVAN) injection 1 mg (1 mg Intravenous Given 12/24/22 8657)      Procedures  /  Critical Care Procedures  ED Course and Medical Decision Making  Initial Impression and Ddx Clinically highly suspicious for kidney stone.  Abdomen soft and nontender, no fever.  Patient is supposed to be getting CT imaging as an outpatient by urology, will obtain here.  Past medical/surgical history that increases complexity of ED encounter: Kidney stones  Interpretation of Diagnostics I personally reviewed the laboratory assessment and my interpretation is as follows: No significant blood count or electrolyte disturbance.  Urinalysis normal.  CT without kidney stone.    Patient Reassessment and Ultimate Disposition/Management     Patient feeling better, vital signs normal, continued reassuring abdominal exam.  Suspect MSK related cause of pain.  No signs or symptoms of myelopathy.  Highly doubt ovarian torsion or any other emergent process.  Appropriate for discharge.  Patient management required discussion with the following services or consulting groups:  None  Complexity of Problems Addressed Acute illness or injury that poses threat of life of bodily function  Additional Data Reviewed and Analyzed Further history obtained from: Further history from spouse/family member  Additional Factors Impacting ED Encounter Risk Prescriptions  Elmer Sow. Pilar Plate, MD [facility] [email or web address]  Final Clinical Impressions(s) / ED Diagnoses     ICD-10-CM   1. Flank pain  R10.9       ED Discharge Orders          Ordered    naproxen (NAPROSYN) 500 MG tablet  2 times daily        12/24/22 0708             Discharge Instructions Discussed with and Provided to Patient:     Discharge Instructions      You were evaluated in the Emergency Department and after careful evaluation, we did not find any emergent condition requiring admission or further testing in the hospital.  Your exam/testing today is  overall reassuring.  Symptoms likely due to a muscular strain or spasm.  Take the Naprosyn anti-inflammatory as directed.  Follow-up with your regular doctors.  Please return to the Emergency Department if you experience any worsening of your condition.   Thank you for allowing [place] to be a part of your care.       Sabas Sous, MD 12/24/22 [phone]

## 2022-12-24 NOTE — ED Triage Notes (Signed)
Pt reports left flank pain x 1-2 days. Pt states that pain is similar to previous renal stones. Pt does report some nausea associated with the pain.

## 2022-12-24 NOTE — ED Notes (Signed)
Pt transported to CT ?

## 2023-01-11 ENCOUNTER — Emergency Department (HOSPITAL_BASED_OUTPATIENT_CLINIC_OR_DEPARTMENT_OTHER)
Admission: EM | Admit: 2023-01-11 | Discharge: 2023-01-11 | Disposition: A | Discharge status: Home / Self Care | Payer: 59 | Attending: Emergency Medicine | Admitting: Emergency Medicine

## 2023-01-11 ENCOUNTER — Encounter (HOSPITAL_BASED_OUTPATIENT_CLINIC_OR_DEPARTMENT_OTHER): Payer: Self-pay | Admitting: Emergency Medicine

## 2023-01-11 ENCOUNTER — Other Ambulatory Visit: Payer: Self-pay

## 2023-01-11 DIAGNOSIS — M79671 Pain in right foot: Secondary | ICD-10-CM | POA: Diagnosis present

## 2023-01-11 DIAGNOSIS — Z91040 Latex allergy status: Secondary | ICD-10-CM | POA: Insufficient documentation

## 2023-01-11 NOTE — ED Provider Notes (Signed)
[facility] Provider Note   CSN: [id number] Arrival date & time: 01/11/23  0030     History  Chief Complaint  Patient presents with   Foot Pain    Tina Anderson is a 37 y.o. female.  37 yo F with a chief complaint of right heel pain.  She said that she was walking and had a sudden severe pain to her right foot.  Seems to come and go at the plantar aspect of the foot adjacent to the right heel.  She denies any obvious injury to it.  She thought it occurred right after she had woken up from a nap.  She says it feels like having glass penetrated her foot.  No pain currently.   Foot Pain       Home Medications Prior to Admission medications   Medication Sig Start Date End Date Taking? Authorizing Provider  ALPRAZolam Prudy Feeler) 0.5 MG tablet Take 2 or three before MRI 12/12/19   Sater, Pearletha Furl, MD  Multiple Vitamin (MULTIVITAMIN PO) Take by mouth.    [provider]  naproxen (NAPROSYN) 500 MG tablet Take 1 tablet (500 mg total) by mouth 2 (two) times daily. 12/24/22   Sabas Sous, MD  ondansetron (ZOFRAN-ODT) 4 MG disintegrating tablet Take 1 tablet (4 mg total) by mouth every 8 (eight) hours as needed for nausea or vomiting. 01/05/21   Benjiman Core, MD  oxyCODONE-acetaminophen (PERCOCET/ROXICET) 5-325 MG tablet Take 1-2 tablets by mouth every 8 (eight) hours as needed for severe pain. 01/05/21   Benjiman Core, MD  sertraline (ZOLOFT) 50 MG tablet Take 50 mg by mouth daily. 10/26/19   [provider]      Allergies    Latex, Milk-related compounds, and Penicillins    Review of Systems   Review of Systems  Physical Exam Updated Vital Signs BP 116/73 (BP Location: Right Arm)   Pulse 95   Temp 98 F (36.7 C)   Resp 20   Ht 5\' 5"  (1.651 m)   Wt 86.2 kg   LMP 12/10/2022 (Approximate)   SpO2 100%   BMI 31.62 kg/m  Physical Exam Vitals and nursing note reviewed.  Constitutional:      General: She  is not in acute distress.    Appearance: She is well-developed. She is not diaphoretic.  HENT:     Head: Normocephalic and atraumatic.  Eyes:     Pupils: Pupils are equal, round, and reactive to light.  Cardiovascular:     Rate and Rhythm: Normal rate and regular rhythm.     Heart sounds: No murmur heard.    No friction rub. No gallop.  Pulmonary:     Effort: Pulmonary effort is normal.     Breath sounds: No wheezing or rales.  Abdominal:     General: There is no distension.     Palpations: Abdomen is soft.     Tenderness: There is no abdominal tenderness.  Musculoskeletal:        General: No tenderness.     Cervical back: Normal range of motion and neck supple.  Skin:    General: Skin is warm and dry.     Comments: I do not appreciate any obvious abnormality to the plantar aspect of the right foot.  There is no edema there is no breakdown of the skin there is no corn no obvious foreign body.  Neurological:     Mental Status: She is alert and oriented to  person, place, and time.  Psychiatric:        Behavior: Behavior normal.     ED Results / Procedures / Treatments   Labs (all labs ordered are listed, but only abnormal results are displayed) Labs Reviewed - No data to display  EKG None  Radiology No results found.  Procedures Procedures    Medications Ordered in ED Medications - No data to display  ED Course/ Medical Decision Making/ A&P                                 Medical Decision Making  37 yo F with a chief complaint of right heel pain.  This started less than an hour before she came to the emergency department.  Has no pain now.  I do not feel she benefit from imaging.  Will give her crutches for comfort.  Have her follow-up with her family doctor in the office.  3:00 AM:  I have discussed the diagnosis/risks/treatment options with the patient.  Evaluation and diagnostic testing in the emergency department does not suggest an emergent condition  requiring admission or immediate intervention beyond what has been performed at this time.  They will follow up with PCP. We also discussed returning to the ED immediately if new or worsening sx occur. We discussed the sx which are most concerning (e.g., sudden worsening pain, fever, inability to tolerate by mouth) that necessitate immediate return. Medications administered to the patient during their visit and any new prescriptions provided to the patient are listed below.  Medications given during this visit Medications - No data to display   The patient appears reasonably screen and/or stabilized for discharge and I doubt any other medical condition or other [facility] requiring further screening, evaluation, or treatment in the ED at this time prior to discharge.          Final Clinical Impression(s) / ED Diagnoses Final diagnoses:  Foot pain, right    Rx / DC Orders ED Discharge Orders     None         Melene Plan, DO 01/11/23 0300

## 2023-01-11 NOTE — ED Triage Notes (Addendum)
R heel pain starting suddenly tonight. "Feels like glass randomly" though she feels certain she has not stepped on glass or experienced any trauma to foot.   Tried ice, elevation, soaking her feet, tylenol without relief

## 2023-01-11 NOTE — Discharge Instructions (Signed)
The most common thing that would hurt at the bottom of the foot would be plantar fasciitis.  Typically this is worse first thing when you get up you start trying to walk it hurts severely and then improves over time.  Treatment for this typically is supportive footwear all the time including when you are walking around at home.  Please follow-up with your family doctor.  I have given you crutches that you can try and keep your weight off of it and see if that helps improve your symptoms as well.

## 2023-09-26 ENCOUNTER — Other Ambulatory Visit: Payer: Self-pay | Admitting: Family Medicine

## 2023-09-26 DIAGNOSIS — R109 Unspecified abdominal pain: Secondary | ICD-10-CM

## 2023-10-05 ENCOUNTER — Encounter: Payer: Self-pay | Admitting: Family Medicine

## 2023-10-05 ENCOUNTER — Ambulatory Visit
Admission: RE | Admit: 2023-10-05 | Discharge: 2023-10-05 | Disposition: A | Discharge status: Home / Self Care | Source: Ambulatory Visit | Attending: Family Medicine | Admitting: Family Medicine

## 2023-10-05 DIAGNOSIS — R109 Unspecified abdominal pain: Secondary | ICD-10-CM
